# Patient Record
Sex: Female | Born: 1950
Health system: Southern US, Community
[De-identification: ages and names within clinical notes are randomized; demographics above are authoritative.]

## PROBLEM LIST (undated history)

## (undated) DIAGNOSIS — D573 Sickle-cell trait: Secondary | ICD-10-CM

## (undated) DIAGNOSIS — T7840XA Allergy, unspecified, initial encounter: Secondary | ICD-10-CM

## (undated) DIAGNOSIS — Z8051 Family history of malignant neoplasm of kidney: Secondary | ICD-10-CM

## (undated) DIAGNOSIS — D649 Anemia, unspecified: Secondary | ICD-10-CM

## (undated) DIAGNOSIS — C50919 Malignant neoplasm of unspecified site of unspecified female breast: Secondary | ICD-10-CM

## (undated) DIAGNOSIS — Z8052 Family history of malignant neoplasm of bladder: Secondary | ICD-10-CM

## (undated) DIAGNOSIS — C801 Malignant (primary) neoplasm, unspecified: Secondary | ICD-10-CM

## (undated) DIAGNOSIS — Z923 Personal history of irradiation: Secondary | ICD-10-CM

## (undated) DIAGNOSIS — Z803 Family history of malignant neoplasm of breast: Secondary | ICD-10-CM

## (undated) DIAGNOSIS — I10 Essential (primary) hypertension: Secondary | ICD-10-CM

## (undated) HISTORY — DX: Family history of malignant neoplasm of kidney: Z80.51

## (undated) HISTORY — DX: Malignant (primary) neoplasm, unspecified: C80.1

## (undated) HISTORY — DX: Allergy, unspecified, initial encounter: T78.40XA

## (undated) HISTORY — PX: ABDOMINAL HYSTERECTOMY: SHX81

## (undated) HISTORY — DX: Family history of malignant neoplasm of breast: Z80.3

## (undated) HISTORY — DX: Malignant neoplasm of unspecified site of unspecified female breast: C50.919

## (undated) HISTORY — PX: BREAST SURGERY: SHX581

## (undated) HISTORY — DX: Family history of malignant neoplasm of bladder: Z80.52

## (undated) HISTORY — PX: CHOLECYSTECTOMY: SHX55

---

## 2011-10-24 DIAGNOSIS — Z923 Personal history of irradiation: Secondary | ICD-10-CM

## 2011-10-24 HISTORY — DX: Personal history of irradiation: Z92.3

## 2012-04-19 HISTORY — PX: BREAST BIOPSY: SHX20

## 2012-05-20 HISTORY — PX: BREAST LUMPECTOMY: SHX2

## 2013-06-27 ENCOUNTER — Telehealth: Payer: Self-pay | Admitting: *Deleted

## 2013-06-27 NOTE — Telephone Encounter (Signed)
Pt brought her records in and requested to see Dr. Darnelle Catalan.  I confirmed 07/31/13 appt w/ pt.  Mailed before appt letter & packet to pt.  Took paperwork to Med Rec for chart.

## 2013-07-08 ENCOUNTER — Other Ambulatory Visit: Payer: Self-pay | Admitting: *Deleted

## 2013-07-08 DIAGNOSIS — C50412 Malignant neoplasm of upper-outer quadrant of left female breast: Secondary | ICD-10-CM

## 2013-07-08 DIAGNOSIS — C50419 Malignant neoplasm of upper-outer quadrant of unspecified female breast: Secondary | ICD-10-CM | POA: Insufficient documentation

## 2013-07-31 ENCOUNTER — Other Ambulatory Visit (HOSPITAL_BASED_OUTPATIENT_CLINIC_OR_DEPARTMENT_OTHER): Payer: Medicare HMO | Admitting: Lab

## 2013-07-31 ENCOUNTER — Ambulatory Visit (HOSPITAL_BASED_OUTPATIENT_CLINIC_OR_DEPARTMENT_OTHER): Payer: Medicare HMO | Admitting: Oncology

## 2013-07-31 ENCOUNTER — Ambulatory Visit: Payer: Medicare HMO

## 2013-07-31 VITALS — BP 145/78 | HR 75 | Temp 98.5°F | Resp 20 | Ht 61.0 in | Wt 228.2 lb

## 2013-07-31 DIAGNOSIS — C50419 Malignant neoplasm of upper-outer quadrant of unspecified female breast: Secondary | ICD-10-CM

## 2013-07-31 DIAGNOSIS — C50412 Malignant neoplasm of upper-outer quadrant of left female breast: Secondary | ICD-10-CM

## 2013-07-31 DIAGNOSIS — Z170 Estrogen receptor positive status [ER+]: Secondary | ICD-10-CM

## 2013-07-31 LAB — CBC WITH DIFFERENTIAL/PLATELET
BASO%: 0.7 % (ref 0.0–2.0)
Basophils Absolute: 0 10*3/uL (ref 0.0–0.1)
EOS%: 0 % (ref 0.0–7.0)
HGB: 10.8 g/dL — ABNORMAL LOW (ref 11.6–15.9)
MCH: 24.1 pg — ABNORMAL LOW (ref 25.1–34.0)
MCHC: 32 g/dL (ref 31.5–36.0)
NEUT#: 4.1 10*3/uL (ref 1.5–6.5)
RBC: 4.5 10*6/uL (ref 3.70–5.45)
RDW: 14.5 % (ref 11.2–14.5)
lymph#: 1.3 10*3/uL (ref 0.9–3.3)

## 2013-07-31 LAB — COMPREHENSIVE METABOLIC PANEL (CC13)
ALT: 13 U/L (ref 0–55)
AST: 17 U/L (ref 5–34)
Albumin: 3.4 g/dL — ABNORMAL LOW (ref 3.5–5.0)
Alkaline Phosphatase: 89 U/L (ref 40–150)
Anion Gap: 9 mEq/L (ref 3–11)
Calcium: 9.6 mg/dL (ref 8.4–10.4)
Chloride: 107 mEq/L (ref 98–109)
Creatinine: 0.8 mg/dL (ref 0.6–1.1)
Potassium: 3.4 mEq/L — ABNORMAL LOW (ref 3.5–5.1)
Sodium: 144 mEq/L (ref 136–145)

## 2013-07-31 NOTE — Progress Notes (Signed)
ID: Sally Morrison OB: 02/12/1951  MR#: [id number]  CSN#:[id number]  PCP: No PCP Per Patient GYN:   SU:  OTHER MD: the patient's team in [place] consisted of Todd Campbell/ SU [phone]), [name]/ ROnc [phone]) and Consuello Closs Roy/ MOnc [phone])  CHIEF COMPLAINT:  I have a history of breast cancer  HISTORY OF PRESENT ILLNESS: The patient noted a palpable lump in the 2:00 position of the left breast sometime January of 2013. She eventually brought it to medical attention and bilateral diagnostic mammography and left breast ultrason[facility] [place] 01/30/2012 showed interval increase in nodular densities in the upper outer quadrant of the left breast which had been noted and evaluated previously in January of 2011 and November of 2010. Ultrasonography of the left breast showed a 1.4 cm hypoechoic solid lesion, with 2 additional lesions, all very close to each other, in the area of palpable concern... MRI of the left breast with and without contrast 02/23/2012 showed thick walled cystic lesions adjacent to each other in the upper outer quadrant of the left breast measuring altogether 2.2 cm. There was some right axillary adenopathy, the largest lymph node measuring 1.2 cm with "somewhat lobular contours".  On 04/19/2012 the patient underwent biopsy of the area in question in the left breast upper outer quadrant, showing an invasive ductal carcinoma, grade 1, estrogen receptor 90% positive, progesterone receptor 50% positive, with no HER-2 amplification and an Mib-1 of 9%. P53 was negative.. on 05/20/2012 the patient underwent left lumpectomy and sentinel lymph node sampling, with the final pathology confirming a 1.5 cm invasive ductal carcinoma, grade 1, estrogen receptor 100% positive, progesterone receptor 52% positive, with no HER-2 amplification and an MIB-1 of 8%. 0 of 3 sentinel lymph nodes were involved.  The patient's  subsequent history is as detailed below.  INTERVAL HISTORY: Giara was evaluated in the breast clinic 07/30/2013. She'll establish herself in my practice that day  REVIEW OF SYSTEMS: She has some dental problems and feels she is due for a visual checkup. She has occasional palpitations which are closely associated with have much caffeine she takes. There are no associated symptoms. This is now much reduced since she is only having one cup of coffee daily. She has a history of urinary tract infections, but not currently. She has chronic mild back pain and sometimes has difficulty walking as a result. This is not more pronounced or persistent than prior. She has mild hot flashes. These were present before she started anastrozole and did not worsen. She denies problems with vaginal dryness or diffuse arthralgias/myalgias. Aside from the above a detailed review of systems today was noncontributory  PAST MEDICAL HISTORY: No past medical history on file.  PAST SURGICAL HISTORY: No past surgical history on file. Status post hysterectomy and bilateral salpingo-oophorectomy, status post cholecystectomy, status post left lumpectomy and sentinel lymph node sampling  FAMILY HISTORY No family history on file. The patient's father died with dementia at the age of 60. The patient's mother died at the age of 60 from a myocardial infarction. [name] history of rheumatoid arthritis. The patient had 1 full brother and one full sister, as well as several half siblings. There is no history of breast or ovarian cancer in the immediate family.  GYNECOLOGIC HISTORY:  Menarche age 62, first live birth age 62, she is [place] P2. She had her total abdominal hysterectomy and bilateral salpingo-oophorectomy in 2003. She did not use hormone replacement . 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 She is a widow and recently moved to  this area to live with her daughter. The patient's son Gracynn Rajewski lives in [place], [place] and works as an Airline pilot. The patient's daughter Twala Collings teaches special added here in [place]. The patient has 4 grandchildren. She attends "[employer] church for all".    ADVANCED DIRECTIVES: Not in place; at her initial visit on 07/30/2013 the patient was given healthcare power of attorney forms to complete and bring back so they can be scanned into her records.   HEALTH MAINTENANCE: History  Substance Use Topics  . Smoking status: Not on file  . Smokeless tobacco: Not on file  . Alcohol Use: Not on file     Colonoscopy: 2011  PAP: June 2014  Bone density: July 2013  Lipid panel:  Allergies not on file  No current outpatient prescriptions on file.   No current facility-administered medications for this visit.    OBJECTIVE: 62-year-old [place] American woman who appears well Filed Vitals:   07/31/13 1638  BP: 145/78  Pulse: 75  Temp: 98.5 F (36.9 C)  Resp: 20     Body mass index is 43.14 kg/(m^2).    ECOG FS:0 - Asymptomatic  Ocular: Sclerae unicteric, pupils equal, round and reactive to light Ear-nose-throat: Oropharynx clear, dentition fair Lymphatic: No cervical or supraclavicular adenopathy Lungs no rales or rhonchi, good excursion bilaterally Heart regular rate and rhythm, no murmur appreciated Abd soft, nontender, positive bowel sounds MSK no focal spinal tenderness, no joint edema Neuro: non-focal, well-oriented, appropriate affect Breasts: The right breast is unremarkable; the left breast is status post lumpectomy and radiation. There is still moderate hyperpigmentation. There is no evidence of local recurrence. The left axilla is unremarkable   LAB RESULTS:  CMP     Component Value Date/Time   NA 144 07/31/2013 1620    I No results found for this basename: SPEP,  UPEP,   kappa and lambda light chains    Lab Results  Component Value Date    WBC 6.0 07/31/2013      Chemistry      Component Value Date/Time   NA 144 07/31/2013 1620      Component Value Date/Time   CALCIUM 9.6 07/31/2013 1620       No results found for this basename: LABCA2    No components found with this basename: LABCA125    No results found for this basename: INR,  in the last 168 hours  Urinalysis No results found for this basename: colorurine,  appearanceur,  labspec,  phurine,  glucoseu,  hgbur,  bilirubinur,  ketonesur,  proteinur,  urobilinogen,  nitrite,  leukocytesur    STUDIES: Outside studies reviewed with patient. Next mammography will be due June of 2014  ASSESSMENT: 62 y.o. [place] woman  (1) status post left lumpectomy and sentinel lymph node biopsy 05/20/2012 for a pT1c pN0, stage IA invasive ductal carcinoma, grade 1, estrogen receptor 100% positive, progesterone receptor 52% positive, with no HER-2 amplification and an MIB-1 of 8%  (2) Oncotype DX score of 0 predicted a distant disease risk of recurrence over 10 years of approximately 3% if the patient's only systemic treatment was tamoxifen for 5 years. It also predicted no benefit from chemotherapy.  (3) the patient completed adjuvant radiation 09/17/2012, receiving 36 treatments to the left breast using medial and lateral tangential fields to  a dose of 5040 cGy, followed by a boost to the postoperative bed of an additional 1440 cGy (given dose 6648).  (4) anastrozole started December 2013.  PLAN: We reviewed her diagnosis, treatment history, and prognosis. We went into the difference between tamoxifen and the aromatase inhibitors. When she completes 2 years of anastrozole she will have the option of continuing for another 3 years or switching to tamoxifen. We will obtain a repeat bone density at that time to help [place] with that decision.  Otherwise she will return to see [place] again in February and then late June, after her mammogram. At that point we will start seeing her on  an every 6 month basis.  The patient understands the purpose of her treatment is cure. She agrees with the treatment plan as outlined. She knows to call for any problems that may develop before next visit.  Lowella Dell, MD   07/31/2013 6:38 PM

## 2013-07-31 NOTE — Addendum Note (Signed)
Addended by: Lowella Dell on: 07/31/2013 06:51 PM   Modules accepted: Orders

## 2013-08-04 ENCOUNTER — Telehealth: Payer: Self-pay | Admitting: Oncology

## 2013-08-04 NOTE — Telephone Encounter (Signed)
m °

## 2013-09-08 ENCOUNTER — Encounter: Payer: Self-pay | Admitting: *Deleted

## 2013-09-08 NOTE — Progress Notes (Signed)
Mailed after appt letter to pt. 

## 2013-09-12 ENCOUNTER — Other Ambulatory Visit: Payer: Self-pay | Admitting: *Deleted

## 2013-09-12 MED ORDER — ANASTROZOLE 1 MG PO TABS: 1.0000 mg | ORAL_TABLET | Freq: Every day | ORAL | Status: DC

## 2013-09-17 ENCOUNTER — Other Ambulatory Visit: Payer: Self-pay | Admitting: *Deleted

## 2013-09-17 MED ORDER — ANASTROZOLE 1 MG PO TABS: 1.0000 mg | ORAL_TABLET | Freq: Every day | ORAL | Status: DC

## 2013-12-03 ENCOUNTER — Telehealth: Payer: Self-pay | Admitting: *Deleted

## 2013-12-03 NOTE — Telephone Encounter (Signed)
Pt called to see if she was scheduled for a f/u appt w/ Dr. Jana Hakim.  Looked and saw that she was scheduled for labs and appt w/ Amy B.  Gave pt that information and she was fine with that.  Mailed calendar to pt per her request.

## 2013-12-08 ENCOUNTER — Other Ambulatory Visit: Payer: Self-pay | Admitting: Physician Assistant

## 2013-12-08 NOTE — Progress Notes (Signed)
I spoke with patient by phone today. Due to predicted inclement weather on 12/09/2013, she would like to move her appointments for lab and visit from 12/09/2013 to 12/23/2013. A POF was generated to schedule a lab appointment at 1:15, and an appointment with Micah Flesher at 145 on 12/23/2013. Her appointments will be canceled for 12/09/2013. Patient voices understanding of the above.  Micah Flesher, PA-C 12/08/2013

## 2013-12-09 ENCOUNTER — Other Ambulatory Visit: Payer: Medicare HMO

## 2013-12-09 ENCOUNTER — Ambulatory Visit: Payer: Medicare HMO | Admitting: Physician Assistant

## 2013-12-23 ENCOUNTER — Telehealth: Payer: Self-pay | Admitting: Physician Assistant

## 2013-12-23 ENCOUNTER — Encounter: Payer: Self-pay | Admitting: Physician Assistant

## 2013-12-23 ENCOUNTER — Other Ambulatory Visit (HOSPITAL_BASED_OUTPATIENT_CLINIC_OR_DEPARTMENT_OTHER): Payer: Medicare HMO

## 2013-12-23 ENCOUNTER — Ambulatory Visit (HOSPITAL_BASED_OUTPATIENT_CLINIC_OR_DEPARTMENT_OTHER): Payer: Medicare HMO | Admitting: Physician Assistant

## 2013-12-23 VITALS — BP 140/75 | HR 98 | Temp 97.5°F | Resp 18 | Ht 61.0 in | Wt 215.5 lb

## 2013-12-23 DIAGNOSIS — Z780 Asymptomatic menopausal state: Secondary | ICD-10-CM

## 2013-12-23 DIAGNOSIS — C50412 Malignant neoplasm of upper-outer quadrant of left female breast: Secondary | ICD-10-CM

## 2013-12-23 DIAGNOSIS — C50419 Malignant neoplasm of upper-outer quadrant of unspecified female breast: Secondary | ICD-10-CM

## 2013-12-23 DIAGNOSIS — Z853 Personal history of malignant neoplasm of breast: Secondary | ICD-10-CM

## 2013-12-23 DIAGNOSIS — Z170 Estrogen receptor positive status [ER+]: Secondary | ICD-10-CM

## 2013-12-23 LAB — COMPREHENSIVE METABOLIC PANEL (CC13)
ALK PHOS: 90 U/L (ref 40–150)
ALT: 14 U/L (ref 0–55)
ANION GAP: 13 meq/L — AB (ref 3–11)
AST: 16 U/L (ref 5–34)
Albumin: 3.6 g/dL (ref 3.5–5.0)
BUN: 21.2 mg/dL (ref 7.0–26.0)
CALCIUM: 9.6 mg/dL (ref 8.4–10.4)
CO2: 23 mEq/L (ref 22–29)
Chloride: 106 mEq/L (ref 98–109)
Creatinine: 1.5 mg/dL — ABNORMAL HIGH (ref 0.6–1.1)
Glucose: 115 mg/dl (ref 70–140)
Potassium: 3.4 mEq/L — ABNORMAL LOW (ref 3.5–5.1)
Sodium: 142 mEq/L (ref 136–145)
Total Bilirubin: 0.5 mg/dL (ref 0.20–1.20)
Total Protein: 7.5 g/dL (ref 6.4–8.3)

## 2013-12-23 LAB — CBC WITH DIFFERENTIAL/PLATELET
BASO%: 0.7 % (ref 0.0–2.0)
BASOS ABS: 0.1 10*3/uL (ref 0.0–0.1)
EOS ABS: 0 10*3/uL (ref 0.0–0.5)
EOS%: 0 % (ref 0.0–7.0)
HCT: 31.8 % — ABNORMAL LOW (ref 34.8–46.6)
HEMOGLOBIN: 10.2 g/dL — AB (ref 11.6–15.9)
LYMPH%: 15.5 % (ref 14.0–49.7)
MCH: 24.9 pg — ABNORMAL LOW (ref 25.1–34.0)
MCHC: 32.1 g/dL (ref 31.5–36.0)
MCV: 77.7 fL — AB (ref 79.5–101.0)
MONO#: 0.5 10*3/uL (ref 0.1–0.9)
MONO%: 6.7 % (ref 0.0–14.0)
NEUT#: 5.7 10*3/uL (ref 1.5–6.5)
NEUT%: 77.1 % — ABNORMAL HIGH (ref 38.4–76.8)
PLATELETS: 270 10*3/uL (ref 145–400)
RBC: 4.09 10*6/uL (ref 3.70–5.45)
RDW: 14.5 % (ref 11.2–14.5)
WBC: 7.4 10*3/uL (ref 3.9–10.3)
lymph#: 1.1 10*3/uL (ref 0.9–3.3)

## 2013-12-23 MED ORDER — ANASTROZOLE 1 MG PO TABS: 1.0000 mg | ORAL_TABLET | Freq: Every day | ORAL | Status: DC

## 2013-12-23 NOTE — Telephone Encounter (Signed)
, °

## 2013-12-23 NOTE — Progress Notes (Signed)
ID: Sally Morrison OB: 10-14-1951  MR#: [id number]  CSN#:[id number]  PCP: Sally Morrison GYN:   SU:  OTHER Morrison: the patient's team in [place] Campbell/ SU [phone]), Sally Morrison/ ROnc [phone]) and [name]/ [place] [phone])  CHIEF COMPLAINT: Hx Left Breast Cancer   BREAST CANCER HISTORY: The patient noted a palpable lump in the 2:00 position of the left breast sometime January of 2013. She eventually brought it to medical attention and bilateral diagnostic mammography and left breast ultrasonography [facility] [place] 01/30/2012 showed interval increase in nodular densities in the upper outer quadrant of the left breast which had been noted and evaluated previously in January of 2011 and November of 2010. Ultrasonography of the left breast showed a 1.4 cm hypoechoic solid lesion, with 2 additional lesions, all very close to each other, in the area of palpable concern... MRI of the left breast with and without contrast 02/23/2012 showed thick walled cystic lesions adjacent to each other in the upper outer quadrant of the left breast measuring altogether 2.2 cm. There was some right axillary adenopathy, the largest lymph node measuring 1.2 cm with "somewhat lobular contours".  On 04/19/2012 the patient underwent biopsy of the area in question in the left breast upper outer quadrant, showing an invasive ductal carcinoma, grade 1, estrogen receptor 90% positive, progesterone receptor 50% positive, with no HER-2 amplification and an Mib-1 of 9%. P53 was negative.. on 05/20/2012 the patient underwent left lumpectomy and sentinel lymph node sampling, with the final pathology confirming a 1.5 cm invasive ductal carcinoma, grade 1, estrogen receptor 100% positive, progesterone receptor 52% positive, with no HER-2 amplification and an MIB-1 of 8%. 0 of 3 sentinel lymph nodes were involved.  The patient's subsequent history is as  detailed below.  INTERVAL HISTORY: Sally Morrison returns alone today for routine followup of her left breast carcinoma. She was last seen here in October 2014. She continues on anastrozole daily with good tolerance. She has occasional hot flashes, but nothing she would consider problematic. She has some arthritic pain in her knees, but has noted no increased joint pain since starting on the aromatase inhibitor. She denies any problems with vaginal dryness. She's had no vaginal bleeding.  Sally Morrison does have a history of iron deficiency anemia which she tells me is being followed by her primary care physician, Dr. Criss Morrison.  In fact she is scheduled to see Dr. Criss Morrison again next month. She does continue on oral iron supplements in the meanwhile.   REVIEW OF SYSTEMS: Sally Morrison's only complaint today is a "toothache" and she has an appointment with her dentist next week. She's had no illnesses and denies any fevers or chills. She denies any skin changes or rashes. Her energy level is good, as is her appetite. She's had no problems with nausea, emesis, reflux, or change in bowel or bladder habits. She denies any cough, shortness of breath, or chest pain. She does have some occasional postsurgical pain in the left breast which she was concerned about. She's noted no additional changes in the left breast, specifically no skin changes, nipple discharge, or masses. She also denies any abnormal headaches or dizziness. She's had no new or unusual myalgias, arthralgias, or bony pain.  A detailed review of systems is otherwise stable and noncontributory.   PAST MEDICAL HISTORY: Past Medical History  Diagnosis Date  . Breast cancer   . Cancer   . Allergy     PAST SURGICAL HISTORY:  History reviewed. No pertinent past surgical history. Status post hysterectomy and bilateral salpingo-oophorectomy, status post cholecystectomy, status post left lumpectomy and sentinel lymph node sampling  FAMILY HISTORY History reviewed. No  pertinent family history. The patient's father died with dementia at the age of 66. The patient's mother died at the age of 13 from a myocardial infarction. Sally Morrison history of rheumatoid arthritis. The patient had 1 full brother and one full sister, as well as several half siblings. There is no history of breast or ovarian cancer in the immediate family.  GYNECOLOGIC HISTORY:   (Updated 12/23/2013) Menarche age 76, first live birth age 71, she is [place] P2. She had her total abdominal hysterectomy and bilateral salpingo-oophorectomy in 2003. She did not use hormone replacement . 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 She is a widow and recently moved to this area to live with her daughter. The patient's son Sally Morrison lives in [place], [place] and works as an Optometrist. The patient's daughter Sally Morrison teaches special ed here in [place]. The patient has 4 grandchildren. She attends "1 church for all".    ADVANCED DIRECTIVES: Not in place; at her initial visit on 07/30/2013 the patient was given healthcare power of attorney forms to complete and bring back so they can be scanned into her records.   HEALTH MAINTENANCE:  (Updated 12/23/2013) History  Substance Use Topics  . Smoking status: Never Smoker   . Smokeless tobacco: Not on file  . Alcohol Use: No     Colonoscopy: 2011  PAP: June 2014  Bone density: July 2013  Lipid panel:  Not on file/Dr. Criss Morrison   Allergies  Allergen Reactions  . Penicillins Rash  . Aleve [Naproxen Sodium] Itching    Current Outpatient Prescriptions  Medication Sig Dispense Refill  . anastrozole (ARIMIDEX) 1 MG tablet Take 1 tablet (1 mg total) by mouth daily.  90 tablet  3  . aspirin 81 MG tablet Take 81 mg by mouth daily.      . calcium-vitamin D (OSCAL WITH D) 500-200 MG-UNIT per tablet Take 1 tablet by mouth 2  (two) times daily.      . ferrous sulfate 325 (65 FE) MG tablet Take 325 mg by mouth daily with breakfast.      . GLUCOSAMINE CHONDROITIN COMPLX PO Take 1 capsule by mouth 4 (four) times daily.      Marland Kitchen ibuprofen (ADVIL,MOTRIN) 200 MG tablet Take 200 mg by mouth every 6 (six) hours as needed for pain.      Marland Kitchen losartan-hydrochlorothiazide (HYZAAR) 50-12.5 MG per tablet        No current facility-administered medications for this visit.    OBJECTIVE: Middle-aged [place] American woman who appears well and is in no acute distress Filed Vitals:   12/23/13 1357  BP: 140/75  Pulse: 98  Temp: 97.5 F (36.4 C)  Resp: 18     Body mass index is 40.74 kg/(m^2).    ECOG FS:0 - Asymptomatic Filed Weights   12/23/13 1357  Weight: 215 lb 8 oz (97.75 kg)   Physical Exam: HEENT:  Sclerae anicteric.  Oropharynx clear, pink, and moist. Neck supple, trachea midline. No thyromegaly.  NODES:  No cervical or supraclavicular lymphadenopathy palpated.  BREAST EXAM: Right breast is unremarkable. Left breast is status post lumpectomy with no suspicious nodularities or skin changes noted. No evidence of local recurrence.  Axillae are benign bilaterally, no palpable lymphadenopathy. LUNGS:  Clear to auscultation bilaterally.  No wheezes or rhonchi HEART:  Regular rate and rhythm. No murmur appreciated. ABDOMEN:  Soft, nontender. No organomegaly or masses palpated.  Positive bowel sounds.  MSK:  No focal spinal tenderness to palpation. Full range of motion bilaterally in the upper extremities. EXTREMITIES:  No peripheral edema.  No lymphedema in the upper extremities. SKIN:  Benign with no visible rashes or skin lesions. No excessive ecchymoses. No petechiae. The skin turgor. NEURO:  Nonfocal. Well oriented.  Appropriate affect.     LAB RESULTS:  CBC    Component Value Date/Time   WBC 7.4 12/23/2013 1337   RBC 4.09 12/23/2013 1337   HGB 10.2* 12/23/2013 1337   HCT 31.8* 12/23/2013 1337   PLT 270 12/23/2013 1337    MCV 77.7* 12/23/2013 1337   MCH 24.9* 12/23/2013 1337   MCHC 32.1 12/23/2013 1337   RDW 14.5 12/23/2013 1337   LYMPHSABS 1.1 12/23/2013 1337   MONOABS 0.5 12/23/2013 1337   EOSABS 0.0 12/23/2013 1337   BASOSABS 0.1 12/23/2013 1337    CMP     Component Value Date/Time   NA 142 12/23/2013 1337   K 3.4* 12/23/2013 1337   CO2 23 12/23/2013 1337   GLUCOSE 115 12/23/2013 1337   BUN 21.2 12/23/2013 1337   CREATININE 1.5* 12/23/2013 1337   CALCIUM 9.6 12/23/2013 1337   PROT 7.5 12/23/2013 1337   ALBUMIN 3.6 12/23/2013 1337   AST 16 12/23/2013 1337   ALT 14 12/23/2013 1337   ALKPHOS 90 12/23/2013 1337   BILITOT 0.50 12/23/2013 1337     STUDIES:  Patient's last mammogram and bone density were obtained in [place]. She will be due for both studies again in June, and [facility] are being [facility]  ASSESSMENT: 63 y.o. [place] woman  (1) status post left lumpectomy and sentinel lymph node biopsy 05/20/2012 for a pT1c pN0, stage IA invasive ductal carcinoma, grade 1, estrogen receptor 100% positive, progesterone receptor 52% positive, with no HER-2 amplification and an MIB-1 of 8%  (2) Oncotype DX score of 0 predicted a distant disease risk of recurrence over 10 years of approximately 3% if the patient's only systemic treatment was tamoxifen for 5 years. It also predicted no benefit from chemotherapy.  (3) the patient completed adjuvant radiation 09/17/2012, receiving 36 treatments to the left breast using medial and lateral tangential fields to a dose of 5040 cGy, followed by a boost to the postoperative bed of an additional 1440 cGy (given dose 6648).  (4) anastrozole started December 2013.  PLAN: With regards to her breast cancer, Sally Morrison seems to be doing quite well, and I'm making no changes to her current regimen. She will continue on the anastrozole which I have refilled for her today. The plan is to complete at least 2 years of anastrozole (December 2015) at which time we'll discussed either  continuing for another 3 years or switching to tamoxifen.   Sally Morrison will have her next annual mammogram and bone density in June, after which I will see her for repeat labs and physical exam. She will then return 6 months later in December to see Dr. Jana Morrison, and at that point they'll discuss any changes in her treatment regimen.  In the meanwhile, she will continue be followed by her primary care physician, Dr.  Criss Morrison, and I am forwarding today's labs to Dr.  Criss Morrison as well.  All of the above was reviewed  with the patient today. She voices understanding and agreement with our plan, and will call with any changes or problems.  Sally Chaddock, PA-C   12/23/2013 2:32 PM

## 2014-01-21 ENCOUNTER — Other Ambulatory Visit: Payer: Self-pay | Admitting: Oncology

## 2014-04-09 ENCOUNTER — Other Ambulatory Visit: Payer: Self-pay | Admitting: Physician Assistant

## 2014-04-14 ENCOUNTER — Telehealth: Payer: Self-pay | Admitting: *Deleted

## 2014-04-14 ENCOUNTER — Other Ambulatory Visit: Payer: Self-pay | Admitting: Gastroenterology

## 2014-04-14 ENCOUNTER — Other Ambulatory Visit (HOSPITAL_BASED_OUTPATIENT_CLINIC_OR_DEPARTMENT_OTHER): Payer: Medicare HMO

## 2014-04-14 ENCOUNTER — Other Ambulatory Visit: Payer: Self-pay

## 2014-04-14 ENCOUNTER — Telehealth: Payer: Self-pay | Admitting: Nurse Practitioner

## 2014-04-14 ENCOUNTER — Ambulatory Visit (HOSPITAL_BASED_OUTPATIENT_CLINIC_OR_DEPARTMENT_OTHER): Payer: Medicare HMO | Admitting: Nurse Practitioner

## 2014-04-14 VITALS — BP 126/72 | HR 77 | Temp 98.4°F | Resp 18 | Ht 61.0 in | Wt 207.2 lb

## 2014-04-14 DIAGNOSIS — C50419 Malignant neoplasm of upper-outer quadrant of unspecified female breast: Secondary | ICD-10-CM

## 2014-04-14 DIAGNOSIS — Z780 Asymptomatic menopausal state: Secondary | ICD-10-CM

## 2014-04-14 DIAGNOSIS — Z170 Estrogen receptor positive status [ER+]: Secondary | ICD-10-CM

## 2014-04-14 DIAGNOSIS — C50412 Malignant neoplasm of upper-outer quadrant of left female breast: Secondary | ICD-10-CM

## 2014-04-14 DIAGNOSIS — Z853 Personal history of malignant neoplasm of breast: Secondary | ICD-10-CM

## 2014-04-14 LAB — CBC WITH DIFFERENTIAL/PLATELET
BASO%: 0.3 % (ref 0.0–2.0)
Basophils Absolute: 0 10*3/uL (ref 0.0–0.1)
EOS%: 0 % (ref 0.0–7.0)
Eosinophils Absolute: 0 10*3/uL (ref 0.0–0.5)
HEMATOCRIT: 34.2 % — AB (ref 34.8–46.6)
HGB: 11 g/dL — ABNORMAL LOW (ref 11.6–15.9)
LYMPH%: 19.7 % (ref 14.0–49.7)
MCH: 24.9 pg — ABNORMAL LOW (ref 25.1–34.0)
MCHC: 32 g/dL (ref 31.5–36.0)
MCV: 77.9 fL — ABNORMAL LOW (ref 79.5–101.0)
MONO#: 0.3 10*3/uL (ref 0.1–0.9)
MONO%: 5.8 % (ref 0.0–14.0)
NEUT%: 74.2 % (ref 38.4–76.8)
NEUTROS ABS: 4.4 10*3/uL (ref 1.5–6.5)
PLATELETS: 278 10*3/uL (ref 145–400)
RBC: 4.4 10*6/uL (ref 3.70–5.45)
RDW: 14.3 % (ref 11.2–14.5)
WBC: 5.9 10*3/uL (ref 3.9–10.3)
lymph#: 1.2 10*3/uL (ref 0.9–3.3)

## 2014-04-14 LAB — COMPREHENSIVE METABOLIC PANEL (CC13)
ALBUMIN: 3.4 g/dL — AB (ref 3.5–5.0)
ALT: 10 U/L (ref 0–55)
AST: 14 U/L (ref 5–34)
Alkaline Phosphatase: 85 U/L (ref 40–150)
Anion Gap: 8 mEq/L (ref 3–11)
BILIRUBIN TOTAL: 0.47 mg/dL (ref 0.20–1.20)
BUN: 16 mg/dL (ref 7.0–26.0)
CALCIUM: 9.5 mg/dL (ref 8.4–10.4)
CO2: 26 mEq/L (ref 22–29)
Chloride: 107 mEq/L (ref 98–109)
Creatinine: 1.1 mg/dL (ref 0.6–1.1)
GLUCOSE: 119 mg/dL (ref 70–140)
POTASSIUM: 3.6 meq/L (ref 3.5–5.1)
Sodium: 141 mEq/L (ref 136–145)
Total Protein: 7.2 g/dL (ref 6.4–8.3)

## 2014-04-14 NOTE — Telephone Encounter (Signed)
per pof to sch pt mamma-BD-sch @ [facility]-adv pt of date & time-gave pt copy of sch

## 2014-04-14 NOTE — Telephone Encounter (Signed)
Spoke with patient and confirmed appointment with Sally Morrison for 04/14/14 at 130pm.  Patient already had labs today at 1pm.

## 2014-04-14 NOTE — Progress Notes (Signed)
ID: Sally Morrison OB: 01-01-1951  MR#: [id number]  CSN#:[id number]  PCP: [name], MD GYN:   SU:  OTHER MD: the patient's team in [place]/ SU [phone]), [name]/ ROnc [phone]) and [name]/ Barrett [phone])  CHIEF COMPLAINT: Hx Left Breast Cancer   BREAST CANCER HISTORY: The patient noted a palpable lump in the 2:00 position of the left breast sometime January of 2013. She eventually brought it to medical attention and bilateral diagnostic mammography and left breast ultrason[facility] [place] 01/30/2012 showed interval increase in nodular densities in the upper outer quadrant of the left breast which had been noted and evaluated previously in January of 2011 and November of 2010. Ultrasonography of the left breast showed a 1.4 cm hypoechoic solid lesion, with 2 additional lesions, all very close to each other, in the area of palpable concern... MRI of the left breast with and without contrast 02/23/2012 showed thick walled cystic lesions adjacent to each other in the upper outer quadrant of the left breast measuring altogether 2.2 cm. There was some right axillary adenopathy, the largest lymph node measuring 1.2 cm with "somewhat lobular contours".  On 04/19/2012 the patient underwent biopsy of the area in question in the left breast upper outer quadrant, showing an invasive ductal carcinoma, grade 1, estrogen receptor 90% positive, progesterone receptor 50% positive, with no HER-2 amplification and an Mib-1 of 9%. P53 was negative.. on 05/20/2012 the patient underwent left lumpectomy and sentinel lymph node sampling, with the final pathology confirming a 1.5 cm invasive ductal carcinoma, grade 1, estrogen receptor 100% positive, progesterone receptor 52% positive, with no HER-2 amplification and an MIB-1 of 8%. 0 of 3 sentinel lymph nodes were involved.  The patient's subsequent history is as  detailed below.  INTERVAL HISTORY: Sally Morrison returns alone today for routine followup of her left breast carcinoma. She was last seen here in March  2015. She continues on anastrozole daily with good tolerance. Since we last saw her she has had carpal tunnel surgery on her right wrist and is healing well.  She has some arthritic pain in her knees, but has noted no increased joint pain since starting on the aromatase inhibitor. She denies any problems with vaginal dryness. She's had no vaginal bleeding. She has had no other issues, hospitalizations or illness since we last saw her in March.   Sally Morrison have a history of iron deficiency anemia which she tells me is being followed by her primary care physician, [name]. She Morrison continue on oral iron supplements.  REVIEW OF SYSTEMS: Sally Morrison only complaint today is a "toothache" and she has an appointment with her dentist next week. She's had no illnesses and denies any fevers or chills. She denies any skin changes or rashes. Her energy level is good, as is her appetite. She's had no problems with nausea, emesis, reflux, or change in bowel or bladder habits. She denies any cough, shortness of breath, or chest pain. She Morrison have some occasional postsurgical pain in the left breast which she was concerned about. She's noted no additional changes in the left breast, specifically no skin changes, nipple discharge, or masses. She also denies any abnormal headaches or dizziness. She's had no new or unusual myalgias, arthralgias, or bony pain.  A detailed review of systems is otherwise stable and noncontributory.   PAST MEDICAL HISTORY: Past Medical History  Diagnosis Date  . Breast cancer   . Cancer   .  Allergy     PAST SURGICAL HISTORY: No past surgical history on file. Status post hysterectomy and bilateral salpingo-oophorectomy, status post cholecystectomy, status post left lumpectomy and sentinel lymph node sampling  FAMILY HISTORY No family  history on file. The patient's father died with dementia at the age of 85. The patient's mother died at the age of 82 from a myocardial infarction. Sally Morrison history of rheumatoid arthritis. The patient had 1 full brother and one full sister, as well as several half siblings. There is no history of breast or ovarian cancer in the immediate family.  GYNECOLOGIC HISTORY:   (Updated 12/23/2013) Menarche age 56, first live birth age 58, she is [place] P2. She had her total abdominal hysterectomy and bilateral salpingo-oophorectomy in 2003. She did not use hormone replacement . 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 She is a widow and recently moved to this area to live with her daughter. The patient's son Sally Morrison lives in [place], [place] and works as an Optometrist. The patient's daughter Sally Morrison teaches special ed here in [place]. The patient has 4 grandchildren. She attends "[employer] all".    ADVANCED DIRECTIVES: Not in place; at her initial visit on 07/30/2013 the patient was given healthcare power of attorney forms to complete and bring back so they can be scanned into her records.   HEALTH MAINTENANCE:  (Updated 12/23/2013) History  Substance Use Topics  . Smoking status: Never Smoker   . Smokeless tobacco: Not on file  . Alcohol Use: No     Colonoscopy: 2011  PAP: June 2014  Bone density: July 2013  Lipid panel:  Not on file/[name]   Allergies  Allergen Reactions  . Penicillins Rash  . Aleve [Naproxen Sodium] Itching    Current Outpatient Prescriptions  Medication Sig Dispense Refill  . anastrozole (ARIMIDEX) 1 MG tablet Take 1 tablet (1 mg total) by mouth daily.  90 tablet  3  . anastrozole (ARIMIDEX) 1 MG tablet TAKE 1 TABLET BY MOUTH EVERY DAY.  30 tablet  0  . aspirin 81 MG tablet Take 81 mg by mouth daily.      .  calcium-vitamin D (OSCAL WITH D) 500-200 MG-UNIT per tablet Take 1 tablet by mouth 2 (two) times daily.      . ferrous sulfate 325 (65 FE) MG tablet Take 325 mg by mouth daily with breakfast.      . GLUCOSAMINE CHONDROITIN COMPLX PO Take 1 capsule by mouth 4 (four) times daily.      Marland Kitchen ibuprofen (ADVIL,MOTRIN) 200 MG tablet Take 200 mg by mouth every 6 (six) hours as needed for pain.      Marland Kitchen losartan-hydrochlorothiazide (HYZAAR) 50-12.5 MG per tablet        No current facility-administered medications for this visit.    OBJECTIVE: Middle-aged [place] American woman who appears well and is in no acute distress Filed Vitals:   04/14/14 1345  BP: 126/72  Pulse: 77  Temp: 98.4 F (36.9 C)  Resp: 18     Body mass index is 39.17 kg/(m^2).    ECOG FS:0 - Asymptomatic Filed Weights   04/14/14 1345  Weight: 207 lb 3.2 oz (93.985 kg)   Physical Exam: HEENT:  Sclerae anicteric.  Oropharynx clear, pink, and moist. Neck supple, trachea midline. No thyromegaly.  NODES:  No cervical or supraclavicular lymphadenopathy palpated.  BREAST EXAM: Right breast is unremarkable. Left breast is status post lumpectomy with no suspicious nodularities or skin changes noted. She Morrison have some thickening in the lower quadrant which she relates is unchanged since radiation. She is due for mammogram now.  No evidence of local recurrence. Axillae are benign bilaterally, no palpable lymphadenopathy. LUNGS:  Clear to auscultation bilaterally.  No wheezes or rhonchi HEART:  Regular rate and rhythm. No murmur appreciated. ABDOMEN:  Soft, nontender. No organomegaly or masses palpated.  Positive bowel sounds.  MSK:  No focal spinal tenderness to palpation. Full range of motion bilaterally in the upper extremities. EXTREMITIES:  No peripheral edema.  No lymphedema in the upper extremities. SKIN:  Benign with no visible rashes or skin lesions. No excessive ecchymoses. No petechiae. The skin turgor. NEURO:  Nonfocal. Well  oriented.  Appropriate affect.     LAB RESULTS:  CBC    Component Value Date/Time   WBC 5.9 04/14/2014 1304   RBC 4.40 04/14/2014 1304   HGB 11.0* 04/14/2014 1304   HCT 34.2* 04/14/2014 1304   PLT 278 04/14/2014 1304   MCV 77.9* 04/14/2014 1304   MCH 24.9* 04/14/2014 1304   MCHC 32.0 04/14/2014 1304   RDW 14.3 04/14/2014 1304   LYMPHSABS 1.2 04/14/2014 1304   MONOABS 0.3 04/14/2014 1304   EOSABS 0.0 04/14/2014 1304   BASOSABS 0.0 04/14/2014 1304    CMP     Component Value Date/Time   NA 141 04/14/2014 1304   K 3.6 04/14/2014 1304   CO2 26 04/14/2014 1304   GLUCOSE 119 04/14/2014 1304   BUN 16.0 04/14/2014 1304   CREATININE 1.1 04/14/2014 1304   CALCIUM 9.5 04/14/2014 1304   PROT 7.2 04/14/2014 1304   ALBUMIN 3.4* 04/14/2014 1304   AST 14 04/14/2014 1304   ALT 10 04/14/2014 1304   ALKPHOS 85 04/14/2014 1304   BILITOT 0.47 04/14/2014 1304     STUDIES:  Patient's last mammogram and bone density were obtained in [place]. She is due for both studies now (in June), and these are being scheduled at the [facility]. She tried to have this done earlier in the month but they had not yet obtained her old films and told her they needed to wait until those films were available for comparison. She hopes to have this done by the end of next week at the latest.   ASSESSMENT: 63 y.o. [place] woman  (1) status post left lumpectomy and sentinel lymph node biopsy 05/20/2012 for a pT1c pN0, stage IA invasive ductal carcinoma, grade 1, estrogen receptor 100% positive, progesterone receptor 52% positive, with no HER-2 amplification and an MIB-1 of 8%  (2) Oncotype DX score of 0 predicted a distant disease risk of recurrence over 10 years of approximately 3% if the patient's only systemic treatment was tamoxifen for 5 years. It also predicted no benefit from chemotherapy.  (3) the patient completed adjuvant radiation 09/17/2012, receiving 36 treatments to the left breast using medial and lateral  tangential fields to a dose of 5040 cGy, followed by a boost to the postoperative bed of an additional 1440 cGy (given dose 6648).  (4) anastrozole started December 2013.  PLAN: With regards to her breast cancer, Alvis Lemmings seems to be doing quite well, and I'm making no changes to her current regimen. She will continue on the anastrozole. The plan is to complete at least 2 years of anastrozole (December 2015) at which time we'll discussed either continuing for another 3 years or switching  to tamoxifen.   [place] is currently due for  next annual mammogram and bone density and states she plans to have this done later this week or next week. Her physical exam is unremarkable. Marland Kitchen She will then return 6 months later in December to see Dr. Jana Hakim, and at that point they'll discuss any changes in her treatment regimen.  In the meanwhile, she will continue be followed by her primary care physician, Dr.  [name].  All of the above was reviewed with the patient today. She voices understanding and agreement with our plan, and will call with any changes or problems.  [facility], NP AOCNP   04/14/2014 1:59 PM

## 2014-04-15 LAB — VITAMIN D 25 HYDROXY (VIT D DEFICIENCY, FRACTURES): VIT D 25 HYDROXY: 59 ng/mL (ref 30–89)

## 2014-04-21 ENCOUNTER — Ambulatory Visit: Payer: Self-pay | Admitting: Physician Assistant

## 2014-05-01 ENCOUNTER — Encounter (HOSPITAL_COMMUNITY): Payer: Self-pay | Admitting: Pharmacy Technician

## 2014-05-12 ENCOUNTER — Other Ambulatory Visit: Payer: Self-pay

## 2014-05-18 ENCOUNTER — Encounter (HOSPITAL_COMMUNITY): Payer: Self-pay | Admitting: *Deleted

## 2014-05-21 ENCOUNTER — Encounter (HOSPITAL_COMMUNITY)
Admission: RE | Disposition: A | Discharge status: Home / Self Care | Payer: Self-pay | Source: Ambulatory Visit | Attending: Gastroenterology

## 2014-05-21 ENCOUNTER — Ambulatory Visit (HOSPITAL_COMMUNITY): Payer: Medicare HMO | Admitting: Anesthesiology

## 2014-05-21 ENCOUNTER — Encounter (HOSPITAL_COMMUNITY): Payer: Medicare HMO | Admitting: Anesthesiology

## 2014-05-21 ENCOUNTER — Encounter (HOSPITAL_COMMUNITY): Payer: Self-pay | Admitting: Anesthesiology

## 2014-05-21 ENCOUNTER — Ambulatory Visit (HOSPITAL_COMMUNITY)
Admission: RE | Admit: 2014-05-21 | Discharge: 2014-05-21 | Disposition: A | Discharge status: Home / Self Care | Payer: Medicare HMO | Source: Ambulatory Visit | Attending: Gastroenterology | Admitting: Gastroenterology

## 2014-05-21 DIAGNOSIS — Z888 Allergy status to other drugs, medicaments and biological substances status: Secondary | ICD-10-CM | POA: Insufficient documentation

## 2014-05-21 DIAGNOSIS — K620 Anal polyp: Secondary | ICD-10-CM | POA: Diagnosis not present

## 2014-05-21 DIAGNOSIS — I10 Essential (primary) hypertension: Secondary | ICD-10-CM | POA: Insufficient documentation

## 2014-05-21 DIAGNOSIS — Z6839 Body mass index (BMI) 39.0-39.9, adult: Secondary | ICD-10-CM | POA: Diagnosis not present

## 2014-05-21 DIAGNOSIS — Z853 Personal history of malignant neoplasm of breast: Secondary | ICD-10-CM | POA: Diagnosis not present

## 2014-05-21 DIAGNOSIS — D175 Benign lipomatous neoplasm of intra-abdominal organs: Secondary | ICD-10-CM | POA: Diagnosis not present

## 2014-05-21 DIAGNOSIS — Z7982 Long term (current) use of aspirin: Secondary | ICD-10-CM | POA: Diagnosis not present

## 2014-05-21 DIAGNOSIS — K621 Rectal polyp: Secondary | ICD-10-CM

## 2014-05-21 DIAGNOSIS — D649 Anemia, unspecified: Secondary | ICD-10-CM | POA: Diagnosis not present

## 2014-05-21 DIAGNOSIS — D573 Sickle-cell trait: Secondary | ICD-10-CM | POA: Diagnosis not present

## 2014-05-21 DIAGNOSIS — Z79899 Other long term (current) drug therapy: Secondary | ICD-10-CM | POA: Diagnosis not present

## 2014-05-21 DIAGNOSIS — Z1211 Encounter for screening for malignant neoplasm of colon: Secondary | ICD-10-CM | POA: Diagnosis present

## 2014-05-21 DIAGNOSIS — Z880 Allergy status to penicillin: Secondary | ICD-10-CM | POA: Diagnosis not present

## 2014-05-21 HISTORY — DX: Essential (primary) hypertension: I10

## 2014-05-21 HISTORY — PX: COLONOSCOPY WITH PROPOFOL: SHX5780

## 2014-05-21 HISTORY — DX: Anemia, unspecified: D64.9

## 2014-05-21 HISTORY — DX: Sickle-cell trait: D57.3

## 2014-05-21 SURGERY — COLONOSCOPY WITH PROPOFOL
Anesthesia: Monitor Anesthesia Care

## 2014-05-21 MED ORDER — PROPOFOL 10 MG/ML IV BOLUS
INTRAVENOUS | Status: AC
  Filled 2014-05-21: qty 20

## 2014-05-21 MED ORDER — LACTATED RINGERS IV SOLN: INTRAVENOUS | Status: DC

## 2014-05-21 MED ORDER — PROMETHAZINE HCL 25 MG/ML IJ SOLN: 6.2500 mg | INTRAMUSCULAR | Status: DC | PRN

## 2014-05-21 MED ORDER — PROPOFOL INFUSION 10 MG/ML OPTIME
INTRAVENOUS | Status: DC | PRN
  Administered 2014-05-21: 100 ug/kg/min via INTRAVENOUS

## 2014-05-21 MED ORDER — PROPOFOL 10 MG/ML IV BOLUS
INTRAVENOUS | Status: DC | PRN
  Administered 2014-05-21: 40 mg via INTRAVENOUS
  Administered 2014-05-21: 20 mg via INTRAVENOUS

## 2014-05-21 MED ORDER — SODIUM CHLORIDE 0.9 % IV SOLN: INTRAVENOUS | Status: DC

## 2014-05-21 MED ORDER — LACTATED RINGERS IV SOLN
INTRAVENOUS | Status: DC | PRN
  Administered 2014-05-21: 07:00:00 via INTRAVENOUS

## 2014-05-21 SURGICAL SUPPLY — 21 items

## 2014-05-21 NOTE — Discharge Instructions (Signed)

## 2014-05-21 NOTE — Op Note (Addendum)
[facility] [place], [address]   OPERATIVE PROCEDURE REPORT  PATIENT: Sally Morrison, Sally Morrison  MR#: [id number] BIRTHDATE: 1951/08/06 GENDER: Female ENDOSCOPIST: Edmonia James, MD ASSISTANT:   Cherylynn Ridges, technician Verlon Au, RN, BSN PROCEDURE DATE: 05/21/2014 PRE-PROCEDURE PREPARATION: The patient was prepped with a gallon of Golytely the night prior to the procedure.  The patient was fasted for 8 hours prior to the procedure.  PRE-PROCEDURE PHYSICAL: Patient has stable vital signs.  Neck is supple.  There is no JVD, thyromegaly or LAD.  Chest clear to auscultation.  S1 and S2 regular.  Abdomen soft, non-distended, non-tender with NABS. PROCEDURE:     Colonoscopy with cold biopsies x 2. ASA CLASS:     Class III INDICATIONS:     1.  Colorectal cancer screening. MEDICATIONS:     Propofol (Diprivan) 168 mg IV.  DESCRIPTION OF PROCEDURE: After the risks, benefits, and alternatives of the procedure were thoroughly explained [including a 10% missed rate of cancer and polyps], informed consent was obtained.  Digital rectal exam was performed.  The Pentax video colonoscope [id number]  was introduced through the anus  and advanced to the cecum. No adverse events experienced.  The quality of the prep was excellent, using Colyte . Multiple washes were done. Small lesions could be missed. The instrument was then slowly withdrawn as the colon was fully examined.    COLON FINDINGS: Two diminutive sessile polyps were found in the rectum and were removed by cold biopsies x 2. A lipoma was seen in the proximal right colon that was recognized by its yellowish submucosal appearance. The rest of the entire colonic mucosa appeared healthy with a normal vascular pattern.  No masses, diverticula or AVMs were noted.  The appendiceal orifice and the ICV were identified and photographed. Retroflexed views revealed no abnormalities.  The patient tolerated the procedure  without immediate complications.  The scope was then withdrawn from the patient and the procedure terminated.   TIME TO CECUM:   06 minutes 00 seconds WITHDRAW TIME:  06 minutes 00 seconds   IMPRESSION:     Two diminutive sessile polyps were found in the rectum-removed by cold biopsies; lipoma in the proximal right colon; otherwise normal colonoscopy upto the cecum.      RECOMMENDATIONS:     1.  Await pathology results. 2.  Hold all NSAIDS including Aspirin for 2 weeks. 3.  Continue current medications. 4.  High fiber diet with liberal fluid intake. 5.  OP follow-up is advised on a PRN basis.   REPEAT EXAM:      for a repeat colonoscopy in 5-10 years, pending biopsy results.  If the patient has any abnormal GI symptoms in the interim, she have been advised to contact the office as soon as possible for further recommendations.   CPT CODES:     [id number], Colonoscopy with biopsies   DIAGNOSIS CODES:     V76.51 Colorectal cancer screening 211.3 Polyps   REFERRED EX:BMWUX [name], M.D.  eSigned:  Dr. Edmonia James, MD 05/21/2014 8:44 AM Revised: 05/21/2014 8:44 AM  PATIENT NAME:  [name], Spoon MR#: [id number]

## 2014-05-21 NOTE — Anesthesia Postprocedure Evaluation (Signed)
Anesthesia Post Note  Patient: Sally Morrison  Procedure(s) Performed: Procedure(s) (LRB): COLONOSCOPY WITH PROPOFOL (N/A)  Anesthesia type: MAC  Patient location: PACU  Post pain: Pain level controlled  Post assessment: Post-op Vital signs reviewed  Last Vitals:  Filed Vitals:   05/21/14 0830  BP: 135/72  Pulse: 69  Temp:   Resp: 21    Post vital signs: Reviewed  Level of consciousness: sedated  Complications: No apparent anesthesia complications

## 2014-05-21 NOTE — Transfer of Care (Signed)
Immediate Anesthesia Transfer of Care Note  Patient: Sally Morrison  Procedure(s) Performed: Procedure(s) (LRB): COLONOSCOPY WITH PROPOFOL (N/A)  Patient Location: PACU  Anesthesia Type: MAC  Level of Consciousness: sedated, patient cooperative and responds to stimulation  Airway & Oxygen Therapy: Patient Spontanous Breathing and Patient connected to face mask oxgen  Post-op Assessment: Report given to PACU RN and Post -op Vital signs reviewed and stable  Post vital signs: Reviewed and stable  Complications: No apparent anesthesia complications

## 2014-05-21 NOTE — Anesthesia Preprocedure Evaluation (Addendum)
Anesthesia Evaluation  Patient identified by MRN, date of birth, ID band Patient awake    Reviewed: Allergy & Precautions, H&P , NPO status , Patient's Chart, lab work & pertinent test results  History of Anesthesia Complications (+) DIFFICULT AIRWAY and history of anesthetic complications (Stated that had a severe sore throat with prior general anesthesia and was informed that ETT placement was difficult. per anesthesia provider.)  Airway Mallampati: III TM Distance: <3 FB Neck ROM: Full  Mouth opening: Limited Mouth Opening  Dental  (+) Chipped, Dental Advisory Given,    Pulmonary neg pulmonary ROS, former smoker,  breath sounds clear to auscultation  Pulmonary exam normal       Cardiovascular hypertension, Pt. on medications Rhythm:Regular Rate:Normal     Neuro/Psych negative neurological ROS  negative psych ROS   GI/Hepatic negative GI ROS, Neg liver ROS,   Endo/Other  Morbid obesity  Renal/GU negative Renal ROS  negative genitourinary   Musculoskeletal negative musculoskeletal ROS (+)   Abdominal   Peds negative pediatric ROS (+)  Hematology  (+) anemia ,   Anesthesia Other Findings   Reproductive/Obstetrics Hx of Breast Cancer                         Anesthesia Physical Anesthesia Plan  ASA: III  Anesthesia Plan: MAC   Post-op Pain Management:    Induction: Intravenous  Airway Management Planned: Simple Face Mask  Additional Equipment:   Intra-op Plan:   Post-operative Plan: Extubation in OR  Informed Consent: I have reviewed the patients History and Physical, chart, labs and discussed the procedure including the risks, benefits and alternatives for the proposed anesthesia with the patient or authorized representative who has indicated his/her understanding and acceptance.   Dental advisory given  Plan Discussed with: CRNA  Anesthesia Plan Comments:         Anesthesia Quick Evaluation

## 2014-05-21 NOTE — H&P (Signed)
Sally Morrison is an 63 y.o. female.   Chief Complaint: Colorectal cancer screening. HPI: Patient is here for a colonoscopy. She has a history of difficult intubation and is therefore having her colonoscopy in a hospital setting. She office notes for further details.  Past Medical History  Diagnosis Date  . Breast cancer   . Cancer   . Allergy   . Hypertension   . Anemia   . Sickle cell trait    Past Surgical History  Procedure Laterality Date  . Breast surgery      left breast lumpectomy  . Abdominal hysterectomy    . Cholecystectomy     History reviewed. No pertinent family history. Social History:  reports that she has quit smoking. Her smoking use included Cigarettes. She smoked 0.00 packs per day. She does not have any smokeless tobacco history on file. She reports that she does not drink alcohol or use illicit drugs.  Allergies:  Allergies  Allergen Reactions  . Penicillins Rash  . Aleve [Naproxen Sodium] Itching   Medications Prior to Admission  Medication Sig Dispense Refill  . anastrozole (ARIMIDEX) 1 MG tablet Take 1 mg by mouth at bedtime.      Marland Kitchen aspirin 81 MG tablet Take 81 mg by mouth every morning.       . calcium-vitamin D (OSCAL WITH D) 500-200 MG-UNIT per tablet Take 1 tablet by mouth 2 (two) times daily.      . ferrous sulfate 325 (65 FE) MG tablet Take 325 mg by mouth daily with breakfast.      . GLUCOSAMINE CHONDROITIN COMPLX PO Take 1 capsule by mouth 4 (four) times daily.      Marland Kitchen ibuprofen (ADVIL,MOTRIN) 200 MG tablet Take 800 mg by mouth every 6 (six) hours as needed for moderate pain.       Marland Kitchen losartan-hydrochlorothiazide (HYZAAR) 50-12.5 MG per tablet Take 0.5 tablets by mouth every morning.       Marland Kitchen VITAMIN D, CHOLECALCIFEROL, PO Take 1 capsule by mouth daily.       No results found for this or any previous visit (from the past 48 hour(s)). No results found.  Review of Systems  Constitutional: Negative.   HENT: Negative.   Eyes: Negative.    Respiratory: Negative.   Cardiovascular: Negative.   Gastrointestinal: Negative.   Genitourinary: Negative.   Musculoskeletal: Positive for joint pain.  Skin: Negative.   Neurological: Negative.   Endo/Heme/Allergies: Negative.   Psychiatric/Behavioral: Negative.    There were no vitals taken for this visit. Physical Exam  Constitutional: She is oriented to person, place, and time. She appears well-developed and well-nourished.  HENT:  Head: Normocephalic and atraumatic.  Eyes: Conjunctivae and EOM are normal. Pupils are equal, round, and reactive to light.  Neck: Normal range of motion. Neck supple.  Cardiovascular: Normal rate and regular rhythm.   Respiratory: Effort normal and breath sounds normal.  GI: Soft. Bowel sounds are normal.  Musculoskeletal: Normal range of motion.  Neurological: She is alert and oriented to person, place, and time.  Skin: Skin is warm and dry.  Psychiatric: She has a normal mood and affect. Her behavior is normal. Judgment and thought content normal.   Assessment/Plan Colorectal cancer screening: Proceed with a colonoscopy at this time.   Meggan Dhaliwal 05/21/2014, 7:14 AM

## 2014-05-25 ENCOUNTER — Encounter (HOSPITAL_COMMUNITY): Payer: Self-pay | Admitting: Gastroenterology

## 2014-05-29 ENCOUNTER — Other Ambulatory Visit: Payer: Self-pay | Admitting: Physician Assistant

## 2014-05-29 DIAGNOSIS — Z853 Personal history of malignant neoplasm of breast: Secondary | ICD-10-CM

## 2014-05-29 DIAGNOSIS — N6489 Other specified disorders of breast: Secondary | ICD-10-CM

## 2014-05-29 DIAGNOSIS — N6459 Other signs and symptoms in breast: Secondary | ICD-10-CM

## 2014-06-01 ENCOUNTER — Other Ambulatory Visit: Payer: Self-pay | Admitting: Nurse Practitioner

## 2014-06-01 DIAGNOSIS — N6489 Other specified disorders of breast: Secondary | ICD-10-CM

## 2014-06-01 DIAGNOSIS — Z780 Asymptomatic menopausal state: Secondary | ICD-10-CM

## 2014-06-01 DIAGNOSIS — Z853 Personal history of malignant neoplasm of breast: Secondary | ICD-10-CM

## 2014-06-01 DIAGNOSIS — N6459 Other signs and symptoms in breast: Secondary | ICD-10-CM

## 2014-06-03 ENCOUNTER — Other Ambulatory Visit: Payer: Self-pay

## 2014-06-03 ENCOUNTER — Other Ambulatory Visit: Payer: Self-pay | Admitting: Oncology

## 2014-06-03 ENCOUNTER — Other Ambulatory Visit: Payer: Self-pay | Admitting: Nurse Practitioner

## 2014-06-03 DIAGNOSIS — N6489 Other specified disorders of breast: Secondary | ICD-10-CM

## 2014-06-03 DIAGNOSIS — Z853 Personal history of malignant neoplasm of breast: Secondary | ICD-10-CM

## 2014-06-03 DIAGNOSIS — N6459 Other signs and symptoms in breast: Secondary | ICD-10-CM

## 2014-06-08 ENCOUNTER — Ambulatory Visit
Admission: RE | Admit: 2014-06-08 | Discharge: 2014-06-08 | Disposition: A | Discharge status: Home / Self Care | Payer: Private Health Insurance - Indemnity | Source: Ambulatory Visit | Attending: Nurse Practitioner | Admitting: Nurse Practitioner

## 2014-06-08 ENCOUNTER — Encounter (INDEPENDENT_AMBULATORY_CARE_PROVIDER_SITE_OTHER): Payer: Self-pay

## 2014-06-08 DIAGNOSIS — N6459 Other signs and symptoms in breast: Secondary | ICD-10-CM

## 2014-06-08 DIAGNOSIS — Z853 Personal history of malignant neoplasm of breast: Secondary | ICD-10-CM

## 2014-06-08 DIAGNOSIS — N6489 Other specified disorders of breast: Secondary | ICD-10-CM

## 2014-09-29 ENCOUNTER — Ambulatory Visit: Payer: Self-pay | Admitting: Oncology

## 2014-12-09 ENCOUNTER — Telehealth: Payer: Self-pay | Admitting: *Deleted

## 2014-12-09 NOTE — Telephone Encounter (Signed)
Pt called asking when her appt is for Dr. Jana Hakim. Informed her that she had an appt back in Dec that she missed.  Went ahead and scheduled and confirmed her to see Dr. Jana Hakim on 12/15/14.

## 2014-12-15 ENCOUNTER — Telehealth: Payer: Self-pay | Admitting: Oncology

## 2014-12-15 ENCOUNTER — Ambulatory Visit (HOSPITAL_BASED_OUTPATIENT_CLINIC_OR_DEPARTMENT_OTHER): Payer: Private Health Insurance - Indemnity | Admitting: Oncology

## 2014-12-15 VITALS — BP 143/65 | HR 61 | Temp 98.6°F | Resp 18 | Ht 61.0 in | Wt 206.0 lb

## 2014-12-15 DIAGNOSIS — Z170 Estrogen receptor positive status [ER+]: Secondary | ICD-10-CM

## 2014-12-15 DIAGNOSIS — C50412 Malignant neoplasm of upper-outer quadrant of left female breast: Secondary | ICD-10-CM

## 2014-12-15 MED ORDER — ANASTROZOLE 1 MG PO TABS: 1.0000 mg | ORAL_TABLET | Freq: Every day | ORAL | Status: DC

## 2014-12-15 NOTE — Telephone Encounter (Signed)
per pof to sch pt appt-sch DEXA *mamma-gave pt copy of sch

## 2014-12-15 NOTE — Progress Notes (Signed)
ID: Sally Morrison OB: May 07, 1951  MR#: [id number]  CSN#:[id number]  PCP: [name], MD GYN:   SU:  OTHER MD: the patient's team in [place]/ SU [phone]), [name]/ ROnc [phone]) and Shailja Roy/ [place] [phone])  CHIEF COMPLAINT:estrogen receptor positive Left Breast Cancer  CURRENT TREATMENT: Anastrozole   BREAST CANCER HISTORY: From the original intake note:  The patient noted a palpable lump in the 2:00 position of the left breast sometime January of 2013. She eventually brought it to medical attention and bilateral diagnostic mammography and left breast ultrasonography [facility] [place] 01/30/2012 showed interval increase in nodular densities in the upper outer quadrant of the left breast which had been noted and evaluated previously in January of 2011 and November of 2010. Ultrasonography of the left breast showed a 1.4 cm hypoechoic solid lesion, with 2 additional lesions, all very close to each other, in the area of palpable concern... MRI of the left breast with and without contrast 02/23/2012 showed thick walled cystic lesions adjacent to each other in the upper outer quadrant of the left breast measuring altogether 2.2 cm. There was some right axillary adenopathy, the largest lymph node measuring 1.2 cm with "somewhat lobular contours".  On 04/19/2012 the patient underwent biopsy of the area in question in the left breast upper outer quadrant, showing an invasive ductal carcinoma, grade 1, estrogen receptor 90% positive, progesterone receptor 50% positive, with no HER-2 amplification and an Mib-1 of 9%. P53 was negative.. on 05/20/2012 the patient underwent left lumpectomy and sentinel lymph node sampling, with the final pathology confirming a 1.5 cm invasive ductal carcinoma, grade 1, estrogen receptor 100% positive, progesterone receptor 52% positive, with no HER-2 amplification and an MIB-1 of  8%. 0 of 3 sentinel lymph nodes were involved.  The patient's subsequent history is as detailed below.  INTERVAL HISTORY: Esteen returns  today for routine followup of her left breast carcinoma accompanied by her grandson Sally Morrison. She continues on anastrozole, with occasional hot flashes, no vaginal dryness, and no arthralgias or myalgias that she is aware of. She obtains it at a good price.  REVIEW OF SYSTEMS: Occasionally she has fleeting pains in the surgical breast. This is not constant or intense.A detailed review of systems today was otherwise entirely negative  PAST MEDICAL HISTORY: Past Medical History  Diagnosis Date  . Breast cancer   . Cancer   . Allergy   . Hypertension   . Anemia   . Sickle cell trait     PAST SURGICAL HISTORY: Past Surgical History  Procedure Laterality Date  . Breast surgery      left breast lumpectomy  . Abdominal hysterectomy    . Cholecystectomy    . Colonoscopy with propofol N/A 05/21/2014    Procedure: COLONOSCOPY WITH PROPOFOL;  Surgeon: Juanita Craver, MD;  Location: [facility];  Service: Endoscopy;  Laterality: N/A;   Status post hysterectomy and bilateral salpingo-oophorectomy, status post cholecystectomy, status post left lumpectomy and sentinel lymph node sampling  FAMILY HISTORY No family history on file. The patient's father died with dementia at the age of 47. The patient's mother died at the age of 18 from a myocardial infarction. [name] history of rheumatoid arthritis. The patient had 1 full brother and one full sister, as well as several half siblings. There is no history of breast or ovarian cancer in the immediate family.  GYNECOLOGIC HISTORY:   (Updated 12/23/2013) Menarche age 76, first live birth  age 47, she is GX P2. She had her total abdominal hysterectomy and bilateral salpingo-oophorectomy in 2003. She did not use hormone replacement . 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 She is a widow and recently moved to this area to live with her daughter. The patient's son Sally Morrison lives in [place], [place] and works as an Optometrist. The patient's daughter Sally Morrison teaches special ed here in [place]. The patient has 4 grandchildren. She attends "[employer] all".    ADVANCED DIRECTIVES: Not in place; at her initial visit on 07/30/2013 the patient was given healthcare power of attorney forms to complete and bring back so they can be scanned into her records.   HEALTH MAINTENANCE:  (Updated 12/23/2013) History  Substance Use Topics  . Smoking status: Former Smoker    Types: Cigarettes  . Smokeless tobacco: Not on file     Comment: smoked in College for 2-3 years  . Alcohol Use: No     Colonoscopy: 2011  PAP: June 2014  Bone density: July 2013  Lipid panel:  Not on file/[name]   Allergies  Allergen Reactions  . Penicillins Rash  . Aleve [Naproxen Sodium] Itching    Current Outpatient Prescriptions  Medication Sig Dispense Refill  . anastrozole (ARIMIDEX) 1 MG tablet Take 1 tablet (1 mg total) by mouth at bedtime. 90 tablet 4  . losartan-hydrochlorothiazide (HYZAAR) 50-12.5 MG per tablet Take 0.5 tablets by mouth every morning.      No current facility-administered medications for this visit.    OBJECTIVE: Middle-aged [place] American woman who appears stated age 65 Vitals:   12/15/14 1621  BP: 143/65  Pulse: 61  Temp: 98.6 F (37 C)  Resp: 18     Body mass index is 38.94 kg/(m^2).    ECOG FS:0 - Asymptomatic Filed Weights   12/15/14 1621  Weight: 206 lb (93.441 kg)   Sclerae unicteric, pupils round and equal Oropharynx clear and moist No cervical or supraclavicular adenopathy Lungs no rales or rhonchi Heart regular rate and rhythm Abd soft,obese, nontender, positive bowel sounds MSK no focal spinal tenderness, no upper extremity  lymphedema Neuro: nonfocal, well oriented, appropriate affect Breasts: the right breast is unremarkable. The left breast is status post lumpectomy and radiation. There is no evidence of local recurrence. The area that occasionally hurts shows no mass or skin change. The left axilla is benign.   LAB RESULTS:  CBC    Component Value Date/Time   WBC 5.9 04/14/2014 1304   RBC 4.40 04/14/2014 1304   HGB 11.0* 04/14/2014 1304   HCT 34.2* 04/14/2014 1304   PLT 278 04/14/2014 1304   MCV 77.9* 04/14/2014 1304   MCH 24.9* 04/14/2014 1304   MCHC 32.0 04/14/2014 1304   RDW 14.3 04/14/2014 1304   LYMPHSABS 1.2 04/14/2014 1304   MONOABS 0.3 04/14/2014 1304   EOSABS 0.0 04/14/2014 1304   BASOSABS 0.0 04/14/2014 1304    CMP     Component Value Date/Time   NA 141 04/14/2014 1304   K 3.6 04/14/2014 1304   CO2 26 04/14/2014 1304   GLUCOSE 119 04/14/2014 1304   BUN 16.0 04/14/2014 1304   CREATININE 1.1 04/14/2014 1304   CALCIUM 9.5 04/14/2014 1304   PROT 7.2 04/14/2014 1304   ALBUMIN 3.4* 04/14/2014 1304   AST 14 04/14/2014 1304   ALT 10 04/14/2014  1304   ALKPHOS 85 04/14/2014 1304   BILITOT 0.47 04/14/2014 1304     STUDIES:  Patient's last mammogram and bone density were obtained in [place]. She will be due for both studies again in June, and these are being scheduled at the [facility].  ASSESSMENT: 64 y.o.  woman  (1) status post left lumpectomy and sentinel lymph node biopsy 05/20/2012 for a pT1c pN0, stage IA invasive ductal carcinoma, grade 1, estrogen receptor 100% positive, progesterone receptor 52% positive, with no HER-2 amplification and an MIB-1 of 8%  (2) Oncotype DX score of 0 predicted a distant disease risk of recurrence over 10 years of approximately 3% if the patient's only systemic treatment was tamoxifen for 5 years. It also predicted no benefit from chemotherapy.  (3) the patient completed adjuvant radiation 09/17/2012, receiving 36 treatments to  the left breast using medial and lateral tangential fields to a dose of 5040 cGy, followed by a boost to the postoperative bed of an additional 1440 cGy (given dose 6648).  (4) anastrozole started December 2013.  PLAN: Grettais tolerating the anastrozole well and the plan will be to continue that for 5 years. We have tried to obtain a bone density previously. It is not clear to me or to her why this has not yet been done. I have placed an order for it to be done in August before her August visit here. Of course she will have lab work at that time as well.  She has a good understanding of the overall plan. She agrees with it. She knows the goal of treatment in her case is cure. She will call with any problems that may develop before her next visit here.  Chauncey Cruel, MD   12/15/2014 4:37 PM

## 2015-06-10 ENCOUNTER — Ambulatory Visit
Admission: RE | Admit: 2015-06-10 | Discharge: 2015-06-10 | Disposition: A | Discharge status: Home / Self Care | Payer: Managed Care, Other (non HMO) | Source: Ambulatory Visit | Attending: Oncology | Admitting: Oncology

## 2015-06-10 ENCOUNTER — Other Ambulatory Visit: Payer: Self-pay | Admitting: Oncology

## 2015-06-10 DIAGNOSIS — C50412 Malignant neoplasm of upper-outer quadrant of left female breast: Secondary | ICD-10-CM

## 2015-06-18 ENCOUNTER — Other Ambulatory Visit: Payer: Self-pay | Admitting: Nurse Practitioner

## 2015-06-18 DIAGNOSIS — C50412 Malignant neoplasm of upper-outer quadrant of left female breast: Secondary | ICD-10-CM

## 2015-06-21 ENCOUNTER — Other Ambulatory Visit (HOSPITAL_BASED_OUTPATIENT_CLINIC_OR_DEPARTMENT_OTHER): Payer: Managed Care, Other (non HMO)

## 2015-06-21 ENCOUNTER — Telehealth: Payer: Self-pay | Admitting: Nurse Practitioner

## 2015-06-21 ENCOUNTER — Encounter: Payer: Self-pay | Admitting: Nurse Practitioner

## 2015-06-21 ENCOUNTER — Ambulatory Visit (HOSPITAL_BASED_OUTPATIENT_CLINIC_OR_DEPARTMENT_OTHER): Payer: Managed Care, Other (non HMO) | Admitting: Nurse Practitioner

## 2015-06-21 VITALS — BP 135/68 | HR 83 | Temp 98.3°F | Resp 18 | Wt 196.1 lb

## 2015-06-21 DIAGNOSIS — C50412 Malignant neoplasm of upper-outer quadrant of left female breast: Secondary | ICD-10-CM

## 2015-06-21 DIAGNOSIS — M8580 Other specified disorders of bone density and structure, unspecified site: Secondary | ICD-10-CM | POA: Insufficient documentation

## 2015-06-21 DIAGNOSIS — Z170 Estrogen receptor positive status [ER+]: Secondary | ICD-10-CM

## 2015-06-21 LAB — COMPREHENSIVE METABOLIC PANEL (CC13)
ALBUMIN: 3.8 g/dL (ref 3.5–5.0)
ALK PHOS: 84 U/L (ref 40–150)
ALT: 15 U/L (ref 0–55)
AST: 16 U/L (ref 5–34)
Anion Gap: 7 mEq/L (ref 3–11)
BILIRUBIN TOTAL: 0.48 mg/dL (ref 0.20–1.20)
BUN: 13.8 mg/dL (ref 7.0–26.0)
CO2: 26 meq/L (ref 22–29)
CREATININE: 1 mg/dL (ref 0.6–1.1)
Calcium: 9.5 mg/dL (ref 8.4–10.4)
Chloride: 109 mEq/L (ref 98–109)
EGFR: 62 mL/min/{1.73_m2} — AB (ref >90)
GLUCOSE: 90 mg/dL (ref 70–140)
Potassium: 4.2 mEq/L (ref 3.5–5.1)
SODIUM: 142 meq/L (ref 136–145)
TOTAL PROTEIN: 7.3 g/dL (ref 6.4–8.3)

## 2015-06-21 LAB — CBC WITH DIFFERENTIAL/PLATELET
BASO%: 1.1 % (ref 0.0–2.0)
Basophils Absolute: 0.1 10*3/uL (ref 0.0–0.1)
EOS ABS: 0 10*3/uL (ref 0.0–0.5)
EOS%: 0 % (ref 0.0–7.0)
HCT: 35.1 % (ref 34.8–46.6)
HEMOGLOBIN: 11.2 g/dL — AB (ref 11.6–15.9)
LYMPH%: 27.7 % (ref 14.0–49.7)
MCH: 24.7 pg — ABNORMAL LOW (ref 25.1–34.0)
MCHC: 32 g/dL (ref 31.5–36.0)
MCV: 77.2 fL — AB (ref 79.5–101.0)
MONO#: 0.6 10*3/uL (ref 0.1–0.9)
MONO%: 9 % (ref 0.0–14.0)
NEUT%: 62.2 % (ref 38.4–76.8)
NEUTROS ABS: 3.8 10*3/uL (ref 1.5–6.5)
Platelets: 286 10*3/uL (ref 145–400)
RBC: 4.54 10*6/uL (ref 3.70–5.45)
RDW: 14.7 % — AB (ref 11.2–14.5)
WBC: 6.2 10*3/uL (ref 3.9–10.3)
lymph#: 1.7 10*3/uL (ref 0.9–3.3)

## 2015-06-21 NOTE — Telephone Encounter (Signed)
Gave avs & calendar for February 2017. °

## 2015-06-21 NOTE — Progress Notes (Signed)
ID: Sally Morrison OB: 1951/01/31  MR#: [id number]  CSN#:[id number]  PCP: [name], MD GYN:   SU:  OTHER MD: the patient's team in [place]/ SU [phone]), [name]/ ROnc [phone]) and Shailja Roy/ [place] [phone])  CHIEF COMPLAINT:estrogen receptor positive Left Breast Cancer  CURRENT TREATMENT: Anastrozole   BREAST CANCER HISTORY: From the original intake note:  The patient noted a palpable lump in the 2:00 position of the left breast sometime January of 2013. She eventually brought it to medical attention and bilateral diagnostic mammography and left breast ultrasonography obtained [facility] [place] 01/30/2012 showed interval increase in nodular densities in the upper outer quadrant of the left breast which had been noted and evaluated previously in January of 2011 and November of 2010. Ultrasonography of the left breast showed a 1.4 cm hypoechoic solid lesion, with 2 additional lesions, all very close to each other, in the area of palpable concern... MRI of the left breast with and without contrast 02/23/2012 showed thick walled cystic lesions adjacent to each other in the upper outer quadrant of the left breast measuring altogether 2.2 cm. There was some right axillary adenopathy, the largest lymph node measuring 1.2 cm with "somewhat lobular contours".  On 04/19/2012 the patient underwent biopsy of the area in question in the left breast upper outer quadrant, showing an invasive ductal carcinoma, grade 1, estrogen receptor 90% positive, progesterone receptor 50% positive, with no HER-2 amplification and an Mib-1 of 9%. P53 was negative.. on 05/20/2012 the patient underwent left lumpectomy and sentinel lymph node sampling, with the final pathology confirming a 1.5 cm invasive ductal carcinoma, grade 1, estrogen receptor 100% positive, progesterone receptor 52% positive, with no HER-2 amplification and an MIB-1 of  8%. 0 of 3 sentinel lymph nodes were involved.  The patient's subsequent history is as detailed below.  INTERVAL HISTORY: Sally Morrison returns today for routine followup of her left breast carcinoma. She has been on anastrozole since December 2013 and is tolerating this drug well. She has occasional hot flashes, but she manages these on her own. She denies vaginal changes, and her arthritis is no worse than her baseline. The interval history is completely unremarkable.   REVIEW OF SYSTEMS: The patient denies fevers, chills, nausea, vomiting, or changes in bowel or bladder habits. she is eating and drinking well. her energy level is appropriate. she has no pain. she denies mouth sores, rashes, or neuropathy symptoms. she has no shortness of breath, chest pain, cough, or palpitations. she denies headaches, dizziness, or vision changes. her mood is stable. A detailed review of systems is otherwise stable.   PAST MEDICAL HISTORY: Past Medical History  Diagnosis Date  . Breast cancer   . Cancer   . Allergy   . Hypertension   . Anemia   . Sickle cell trait     PAST SURGICAL HISTORY: Past Surgical History  Procedure Laterality Date  . Breast surgery      left breast lumpectomy  . Abdominal hysterectomy    . Cholecystectomy    . Colonoscopy with propofol N/A 05/21/2014    Procedure: COLONOSCOPY WITH PROPOFOL;  Surgeon: Juanita Craver, MD;  Location: [facility];  Service: Endoscopy;  Laterality: N/A;   Status post hysterectomy and bilateral salpingo-oophorectomy, status post cholecystectomy, status post left lumpectomy and sentinel lymph node sampling  FAMILY HISTORY No family history on file. The patient's father died with dementia at the age of 9. The patient's mother died  at the age of 6 from a myocardial infarction. Sally Morrison history of rheumatoid arthritis. The patient had 1 full brother and one full sister, as well as several half siblings. There is no history of breast or ovarian cancer in the  immediate family.  GYNECOLOGIC HISTORY:   (Updated 12/23/2013) Menarche age 64, first live birth age 89, she is [place] P2. She had her total abdominal hysterectomy and bilateral salpingo-oophorectomy in 2003. She did not use hormone replacement . 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 She is a widow and recently moved to this area to live with her daughter. The patient's son Sally Morrison lives in [place], [place] and works as an Optometrist. The patient's daughter Sally Morrison teaches special ed here in [place]. The patient has 4 grandchildren. She attends "1 church for all".    ADVANCED DIRECTIVES: Not in place; at her initial visit on 07/30/2013 the patient was given healthcare power of attorney forms to complete and bring back so they can be scanned into her records.   HEALTH MAINTENANCE:  (Updated 12/23/2013) Social History  Substance Use Topics  . Smoking status: Former Smoker    Types: Cigarettes  . Smokeless tobacco: Not on file     Comment: smoked in College for 2-3 years  . Alcohol Use: No     Colonoscopy: 2011  PAP: June 2014  Bone density: July 2013  Lipid panel:  Not on file/[name]   Allergies  Allergen Reactions  . Penicillins Rash  . Aleve [Naproxen Sodium] Itching    Current Outpatient Prescriptions  Medication Sig Dispense Refill  . anastrozole (ARIMIDEX) 1 MG tablet Take 1 tablet (1 mg total) by mouth at bedtime. 90 tablet 4  . losartan-hydrochlorothiazide (HYZAAR) 50-12.5 MG per tablet Take 0.5 tablets by mouth every morning.      No current facility-administered medications for this visit.    OBJECTIVE: Middle-aged [place] American woman who appears stated age 28 Vitals:   06/21/15 1513  BP: 135/68  Pulse: 83  Temp: 98.3 F (36.8 C)  Resp: 18     Body mass index is 37.07 kg/(m^2).    ECOG FS:0 -  Asymptomatic Filed Weights   06/21/15 1513  Weight: 196 lb 1.6 oz (88.95 kg)   Skin: warm, dry  HEENT: sclerae anicteric, conjunctivae pink, oropharynx clear. No thrush or mucositis.  Lymph Nodes: No cervical or supraclavicular lymphadenopathy  Lungs: clear to auscultation bilaterally, no rales, wheezes, or rhonci  Heart: regular rate and rhythm  Abdomen: round, soft, non tender, positive bowel sounds  Musculoskeletal: No focal spinal tenderness, no peripheral edema  Neuro: non focal, well oriented, positive affect  Breasts: left breast status post lumpectomy and radiation. No evidence of recurrent disease. Left axilla benign. Right breast unremarkable.   LAB RESULTS:  CBC    Component Value Date/Time   WBC 6.2 06/21/2015 1448   RBC 4.54 06/21/2015 1448   HGB 11.2* 06/21/2015 1448   HCT 35.1 06/21/2015 1448   PLT 286 06/21/2015 1448   MCV 77.2* 06/21/2015 1448   MCH 24.7* 06/21/2015 1448   MCHC 32.0 06/21/2015 1448   RDW 14.7* 06/21/2015 1448   LYMPHSABS 1.7 06/21/2015 1448   MONOABS 0.6 06/21/2015 1448   EOSABS 0.0 06/21/2015 1448   BASOSABS 0.1 06/21/2015 1448    CMP     Component Value Date/Time   NA 142  06/21/2015 1448   K 4.2 06/21/2015 1448   CO2 26 06/21/2015 1448   GLUCOSE 90 06/21/2015 1448   BUN 13.8 06/21/2015 1448   CREATININE 1.0 06/21/2015 1448   CALCIUM 9.5 06/21/2015 1448   PROT 7.3 06/21/2015 1448   ALBUMIN 3.8 06/21/2015 1448   AST 16 06/21/2015 1448   ALT 15 06/21/2015 1448   ALKPHOS 84 06/21/2015 1448   BILITOT 0.48 06/21/2015 1448     STUDIES:  Dg Bone Density  06/10/2015   CLINICAL DATA:  Postmenopausal. Baseline exam. No hormone replacement therapy. Patient does have history of hysterectomy and oophorectomy  EXAM: DUAL X-RAY ABSORPTIOMETRY (DXA) FOR BONE MINERAL DENSITY  FINDINGS: AP LUMBAR SPINE L2 through L4  Bone Mineral Density (BMD):  0.819 g/cm2  Young Adult T-Score:  -2.4  Z-Score:  -1.6  LEFT FEMUR NECK  Bone Mineral Density  (BMD):  0.744 g/cm2  Young Adult T-Score: -0.9  Z-Score:  -0.3  ASSESSMENT: Patient's diagnostic category is LOW BONE MASS by WHO Criteria.  FRACTURE RISK: INCREASED  FRAX: 10 year fracture risk for major osteoporotic fracture is 3%. Ten year fracture risk for hip fracture is 0.1%.  COMPARISON: None.  Effective therapies are available in the form of bisphosphonates, selective estrogen receptor modulators, biologic agents, and hormone replacement therapy (for women). All patients should ensure an adequate intake of dietary calcium (1200 mg daily) and vitamin D (800 IU daily) unless contraindicated.  All treatment decisions require clinical judgment and consideration of individual patient factors, including patient preferences, co-morbidities, previous drug use, risk factors not captured in the FRAX model (e.g., frailty, falls, vitamin D deficiency, increased bone turnover, interval significant decline in bone density) and possible under- or over-estimation of fracture risk by FRAX.  The National Osteoporosis Foundation recommends that FDA-approved medical therapies be considered in postmenopausal women and men age 30 or older with a:  1. Hip or vertebral (clinical or morphometric) fracture.  2. T-score of -2.5 or lower at the spine or hip.  3. Ten-year fracture probability by FRAX of 3% or greater for hip fracture or 20% or greater for major osteoporotic fracture.  People with diagnosed cases of osteoporosis or at high risk for fracture should have regular bone mineral density tests. For patients eligible for Medicare, routine testing is allowed once every 2 years. The testing frequency can be increased to one year for patients who have rapidly progressing disease, those who are receiving or discontinuing medical therapy to restore bone mass, or have additional risk factors.  World Pharmac[facility]) Criteria:  Normal: T-scores from +1.0 to -1.0  Low Bone Mass (Osteopenia): T-scores between -1.0 and -2.5   Osteoporosis: T-scores -2.5 and below  Comparison to Reference Population:  T-score is the key measure used in the diagnosis of osteoporosis and relative risk determination for fracture. It provides a value for bone mass relative to the mean bone mass of a young adult reference population expressed in terms of standard deviation (SD).  Z-score is the age-matched score showing the patient's values compared to a population matched for age, sex, and race. This is also expressed in terms of standard deviation. The patient may have values that compare favorably to the age-matched values and still be at increased risk for fracture.   Electronically Signed   By: Franki Cabot M.D.   On: 06/10/2015 14:02   Mm Diag Breast Tomo Bilateral  06/10/2015   CLINICAL DATA:  History of left breast cancer in 2013 status post lumpectomy and  radiation therapy.  EXAM: DIGITAL DIAGNOSTIC BILATERAL MAMMOGRAM WITH 3D TOMOSYNTHESIS AND CAD  COMPARISON:  Previous exam(s).  ACR Breast Density Category b: There are scattered areas of fibroglandular density.  FINDINGS: There are stable postsurgical changes within the left breast. Scattered benign-appearing calcifications within each breast are unchanged. There are no new dominant masses, suspicious calcifications or secondary signs of malignancy identified in either breast.  Mammographic images were processed with CAD.  IMPRESSION: No mammographic evidence of malignancy. Stable postsurgical changes within the left breast.  RECOMMENDATION: Screening mammogram in one year.(Code:SM-B-01Y)  I have discussed the findings and recommendations with the patient. Results were also provided in writing at the conclusion of the visit. If applicable, a reminder letter will be sent to the patient regarding the next appointment.  BI-RADS CATEGORY  2: Benign.   Electronically Signed   By: Franki Cabot M.D.   On: 06/10/2015 09:54    ASSESSMENT: 64 y.o. [place] woman  (1) status post left lumpectomy  and sentinel lymph node biopsy 05/20/2012 for a pT1c pN0, stage IA invasive ductal carcinoma, grade 1, estrogen receptor 100% positive, progesterone receptor 52% positive, with no HER-2 amplification and an MIB-1 of 8%  (2) Oncotype DX score of 0 predicted a distant disease risk of recurrence over 10 years of approximately 3% if the patient's only systemic treatment was tamoxifen for 5 years. It also predicted no benefit from chemotherapy.  (3) the patient completed adjuvant radiation 09/17/2012, receiving 36 treatments to the left breast using medial and lateral tangential fields to a dose of 5040 cGy, followed by a boost to the postoperative bed of an additional 1440 cGy (given dose 6648).  (4) anastrozole started December 2013.  (5) osteopenia -- t-score -2.4 on 06/10/15. To begin Prolia every 6 months in Fall 2016 with dentist approval.   PLAN: Makaley is doing well as far as her breast cancer is concerned. She is now 3 years out from her definitive surgery with no evidence of recurrent disease. She is tolerating the anastrozole well and will continue this until December 2018 to complete 5 years of antiestrogen therapy.  I reviewed the results of her bone density scan, and her t-score is down to -2.4. She does not recall the results from her previous bone density scan from [place] 2 years ago, but "osteopenia" or "low bone mass" do not ring any bells with her. I advised her to begin a calcium/vitamin D supplement at once, and continue performing weight bearing activity which she is already frequently participating in. Because she is on active aromatase inhibitor therapy, and this can contribute to bone density loss, I believe she needs to be on a bone strengthening agent as well. We discussed several options, and she was most interested in prolia injections every 6 months. She knows a rare side effect of this drug can include osteonecrosis of the jaw. She is pending some fillings and tooth  extractions with her dentist this fall, so I have asked her to receive permission from him before we initiate this therapy. She will call us back with his approval when she obtains it   If it were not for the bone density issue, I would have moved Sally Morrison to yearly visits at this point. Instead, I will have her return in 6 months to ensure follow up and compliance with initiating the prolia. She understands and agrees with this plan. She knows the goal of treatment in her case is cure. She has been encouraged to call with  any issues that might arise before her next visit here.   Sally Panda, NP   06/21/2015 3:59 PM

## 2015-06-22 LAB — VITAMIN D 25 HYDROXY (VIT D DEFICIENCY, FRACTURES): Vit D, 25-Hydroxy: 31 ng/mL (ref 30–100)

## 2015-08-05 ENCOUNTER — Other Ambulatory Visit (HOSPITAL_COMMUNITY)
Admission: RE | Admit: 2015-08-05 | Discharge: 2015-08-05 | Disposition: A | Discharge status: Home / Self Care | Payer: Managed Care, Other (non HMO) | Source: Ambulatory Visit | Attending: Family Medicine | Admitting: Family Medicine

## 2015-08-05 ENCOUNTER — Other Ambulatory Visit: Payer: Self-pay | Admitting: Family Medicine

## 2015-08-05 DIAGNOSIS — Z1151 Encounter for screening for human papillomavirus (HPV): Secondary | ICD-10-CM | POA: Diagnosis present

## 2015-08-05 DIAGNOSIS — Z01419 Encounter for gynecological examination (general) (routine) without abnormal findings: Secondary | ICD-10-CM | POA: Insufficient documentation

## 2015-08-09 LAB — CYTOLOGY - PAP

## 2015-12-16 ENCOUNTER — Other Ambulatory Visit: Payer: Self-pay | Admitting: Oncology

## 2015-12-20 ENCOUNTER — Other Ambulatory Visit: Payer: Self-pay | Admitting: *Deleted

## 2015-12-20 DIAGNOSIS — C50412 Malignant neoplasm of upper-outer quadrant of left female breast: Secondary | ICD-10-CM

## 2015-12-21 ENCOUNTER — Encounter: Payer: Self-pay | Admitting: Oncology

## 2015-12-21 ENCOUNTER — Telehealth: Payer: Self-pay | Admitting: Oncology

## 2015-12-21 ENCOUNTER — Ambulatory Visit (HOSPITAL_BASED_OUTPATIENT_CLINIC_OR_DEPARTMENT_OTHER): Payer: Managed Care, Other (non HMO) | Admitting: Nurse Practitioner

## 2015-12-21 ENCOUNTER — Other Ambulatory Visit (HOSPITAL_BASED_OUTPATIENT_CLINIC_OR_DEPARTMENT_OTHER): Payer: Managed Care, Other (non HMO)

## 2015-12-21 VITALS — BP 127/56 | HR 75 | Temp 98.4°F | Resp 18 | Ht 61.0 in | Wt 204.4 lb

## 2015-12-21 DIAGNOSIS — M8580 Other specified disorders of bone density and structure, unspecified site: Secondary | ICD-10-CM | POA: Diagnosis not present

## 2015-12-21 DIAGNOSIS — C50412 Malignant neoplasm of upper-outer quadrant of left female breast: Secondary | ICD-10-CM

## 2015-12-21 LAB — CBC WITH DIFFERENTIAL/PLATELET
BASO%: 0.5 % (ref 0.0–2.0)
Basophils Absolute: 0 10*3/uL (ref 0.0–0.1)
EOS ABS: 0 10*3/uL (ref 0.0–0.5)
EOS%: 0 % (ref 0.0–7.0)
HCT: 35.9 % (ref 34.8–46.6)
HGB: 11.6 g/dL (ref 11.6–15.9)
LYMPH%: 24.6 % (ref 14.0–49.7)
MCH: 25 pg — ABNORMAL LOW (ref 25.1–34.0)
MCHC: 32.2 g/dL (ref 31.5–36.0)
MCV: 77.6 fL — AB (ref 79.5–101.0)
MONO#: 0.5 10*3/uL (ref 0.1–0.9)
MONO%: 7.5 % (ref 0.0–14.0)
NEUT%: 67.4 % (ref 38.4–76.8)
NEUTROS ABS: 4.1 10*3/uL (ref 1.5–6.5)
Platelets: 263 10*3/uL (ref 145–400)
RBC: 4.62 10*6/uL (ref 3.70–5.45)
RDW: 14.3 % (ref 11.2–14.5)
WBC: 6.2 10*3/uL (ref 3.9–10.3)
lymph#: 1.5 10*3/uL (ref 0.9–3.3)

## 2015-12-21 LAB — COMPREHENSIVE METABOLIC PANEL
ALT: 13 U/L (ref 0–55)
AST: 19 U/L (ref 5–34)
Albumin: 3.6 g/dL (ref 3.5–5.0)
Alkaline Phosphatase: 80 U/L (ref 40–150)
Anion Gap: 10 mEq/L (ref 3–11)
BILIRUBIN TOTAL: 0.57 mg/dL (ref 0.20–1.20)
BUN: 17.2 mg/dL (ref 7.0–26.0)
CHLORIDE: 107 meq/L (ref 98–109)
CO2: 22 meq/L (ref 22–29)
Calcium: 9.4 mg/dL (ref 8.4–10.4)
Creatinine: 1.1 mg/dL (ref 0.6–1.1)
EGFR: 51 mL/min/{1.73_m2} — AB (ref >90)
GLUCOSE: 133 mg/dL (ref 70–140)
POTASSIUM: 3.7 meq/L (ref 3.5–5.1)
SODIUM: 139 meq/L (ref 136–145)
TOTAL PROTEIN: 7.4 g/dL (ref 6.4–8.3)

## 2015-12-21 NOTE — Telephone Encounter (Signed)
Gave patient avs report and appointments for August.  °

## 2015-12-21 NOTE — Progress Notes (Signed)
ID: Sally Morrison OB: 1950-11-22  MR#: [id number]  CSN#:[id number]  PCP: Sally Peers, MD GYN:   SU:  OTHER MD: the patient's team in Sally Morrison/ SU [phone]), Sally Morrison/ ROnc [phone]) and Sally Morrison/ [place] [phone])  CHIEF COMPLAINT:estrogen receptor positive Left Breast Cancer  CURRENT TREATMENT: Anastrozole  BREAST CANCER HISTORY: From the original intake note:  The patient noted a palpable lump in the 2:00 position of the left breast sometime January of 2013. She eventually brought it to medical attention and bilateral diagnostic mammography and left breast [facility] [place] 01/30/2012 showed interval increase in nodular densities in the upper outer quadrant of the left breast which had been noted and evaluated previously in January of 2011 and November of 2010. Ultrasonography of the left breast showed a 1.4 cm hypoechoic solid lesion, with 2 additional lesions, all very close to each other, in the area of palpable concern... MRI of the left breast with and without contrast 02/23/2012 showed thick walled cystic lesions adjacent to each other in the upper outer quadrant of the left breast measuring altogether 2.2 cm. There was some right axillary adenopathy, the largest lymph node measuring 1.2 cm with "somewhat lobular contours".  On 04/19/2012 the patient underwent biopsy of the area in question in the left breast upper outer quadrant, showing an invasive ductal carcinoma, grade 1, estrogen receptor 90% positive, progesterone receptor 50% positive, with no HER-2 amplification and an Mib-1 of 9%. P53 was negative.. on 05/20/2012 the patient underwent left lumpectomy and sentinel lymph node sampling, with the final pathology confirming a 1.5 cm invasive ductal carcinoma, grade 1, estrogen receptor 100% positive, progesterone receptor 52% positive, with no HER-2 amplification and an MIB-1 of  8%. 0 of 3 sentinel lymph nodes were involved.  The patient's subsequent history is as detailed below.  INTERVAL HISTORY: Sally Morrison returns today for routine followup of her left breast cancer. She has been on anastrozole since December 2013 and is tolerates this well with few complaints. She has occasional hot flashes, but they are not severe enough to require additional meds. She denies vaginal changes, and her arthritis is no worse than her baseline.   REVIEW OF SYSTEMS: Sally Morrison is batting a head cold, but denies fevers or chills. She is stuffy with some congestion and a non productive cough. She has arthritis to her bilateral knees, but this has improved since her last visit as she is working with a Restaurant manager, fast food who injected "supartz," or a derivative of hyaluronic acid derived from rooster combs into the affected area. Her pain is greatly decreased. A detailed review of systems is otherwise entirely negative.   PAST MEDICAL HISTORY: Past Medical History  Diagnosis Date  . Breast cancer   . Cancer   . Allergy   . Hypertension   . Anemia   . Sickle cell trait     PAST SURGICAL HISTORY: Past Surgical History  Procedure Laterality Date  . Breast surgery      left breast lumpectomy  . Abdominal hysterectomy    . Cholecystectomy    . Colonoscopy with propofol N/A 05/21/2014    Procedure: COLONOSCOPY WITH PROPOFOL;  Surgeon: Sally Craver, MD;  Location: [facility];  Service: Endoscopy;  Laterality: N/A;   Status post hysterectomy and bilateral salpingo-oophorectomy, status post cholecystectomy, status post left lumpectomy and sentinel lymph node sampling  FAMILY HISTORY No family history on file. The patient's father died with dementia at the age  of 63. The patient's mother died at the age of 58 from a myocardial infarction. Sally Morrison history of rheumatoid arthritis. The patient had 1 full brother and one full sister, as well as several half siblings. There is no history of breast or ovarian  cancer in the immediate family.  GYNECOLOGIC HISTORY:   (Updated 12/23/2013) Menarche age 65, first live birth age 65, she is [place] P2. She had her total abdominal hysterectomy and bilateral salpingo-oophorectomy in 2003. She did not use hormone replacement . 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 She is a widow and recently moved to this area to live with her daughter. The patient's son Sally Morrison lives in upper [place], [place] and works as an Optometrist. The patient's daughter Sally Morrison teaches special ed here in [place]. The patient has 4 grandchildren. She attends "1 church for all".    ADVANCED DIRECTIVES: Not in place; at her initial visit on 07/30/2013 the patient was given healthcare power of attorney forms to complete and bring back so they can be scanned into her records.   HEALTH MAINTENANCE:  (Updated 12/23/2013) Social History  Substance Use Topics  . Smoking status: Former Smoker    Types: Cigarettes  . Smokeless tobacco: Not on file     Comment: smoked in College for 2-3 years  . Alcohol Use: No     Colonoscopy: 2011  PAP: June 2014  Bone density: July 2013  Lipid panel:  Not on file/Dr. Criss Morrison   Allergies  Allergen Reactions  . Penicillins Rash  . Aleve [Naproxen Sodium] Itching    Current Outpatient Prescriptions  Medication Sig Dispense Refill  . anastrozole (ARIMIDEX) 1 MG tablet TAKE 1 TABLET BY MOUTH AT BEDTIME 90 tablet 0  . losartan-hydrochlorothiazide (HYZAAR) 50-12.5 MG per tablet Take 0.5 tablets by mouth every morning.      No current facility-administered medications for this visit.    OBJECTIVE: Middle-aged [place] American woman who appears stated age 65 Vitals:   12/21/15 1320  BP: 127/56  Pulse: 75  Temp: 98.4 F (36.9 C)  Resp: 18     Body mass index is 38.64 kg/(m^2).    ECOG FS:0 -  Asymptomatic Filed Weights   12/21/15 1320  Weight: 204 lb 6.4 oz (92.715 kg)   Skin: warm, dry  HEENT: sclerae anicteric, conjunctivae pink, oropharynx clear. No thrush or mucositis.  Lymph Nodes: No cervical or supraclavicular lymphadenopathy  Lungs: clear to auscultation bilaterally, no rales, wheezes, or rhonci  Heart: regular rate and rhythm  Abdomen: round, soft, non tender, positive bowel sounds  Musculoskeletal: No focal spinal tenderness, no peripheral edema  Neuro: non focal, well oriented, positive affect  Breasts: left breast status post lumpectomy and radiation. No evidence of recurrent disease. Left axilla benign. Right breast unremarkable.   LAB RESULTS:  CBC    Component Value Date/Time   WBC 6.2 12/21/2015 1307   RBC 4.62 12/21/2015 1307   HGB 11.6 12/21/2015 1307   HCT 35.9 12/21/2015 1307   PLT 263 12/21/2015 1307   MCV 77.6* 12/21/2015 1307   MCH 25.0* 12/21/2015 1307   MCHC 32.2 12/21/2015 1307   RDW 14.3 12/21/2015 1307   LYMPHSABS 1.5 12/21/2015 1307   MONOABS 0.5 12/21/2015 1307   EOSABS 0.0 12/21/2015 1307   BASOSABS 0.0 12/21/2015 1307    CMP     Component Value Date/Time  NA 139 12/21/2015 1307   K 3.7 12/21/2015 1307   CO2 22 12/21/2015 1307   GLUCOSE 133 12/21/2015 1307   BUN 17.2 12/21/2015 1307   CREATININE 1.1 12/21/2015 1307   CALCIUM 9.4 12/21/2015 1307   PROT 7.4 12/21/2015 1307   ALBUMIN 3.6 12/21/2015 1307   AST 19 12/21/2015 1307   ALT 13 12/21/2015 1307   ALKPHOS 80 12/21/2015 1307   BILITOT 0.57 12/21/2015 1307     STUDIES:  No results found.  ASSESSMENT: 65 y.o. [place] woman  (1) status post left lumpectomy and sentinel lymph node biopsy 05/20/2012 for a pT1c pN0, stage IA invasive ductal carcinoma, grade 1, estrogen receptor 100% positive, progesterone receptor 52% positive, with no HER-2 amplification and an MIB-1 of 8%  (2) Oncotype DX score of 0 predicted a distant disease risk of recurrence over 10 years  of approximately 3% if the patient's only systemic treatment was tamoxifen for 5 years. It also predicted no benefit from chemotherapy.  (3) the patient completed adjuvant radiation 09/17/2012, receiving 36 treatments to the left breast using medial and lateral tangential fields to a dose of 5040 cGy, followed by a boost to the postoperative bed of an additional 1440 cGy (given dose 6648).  (4) anastrozole started December 2013.  (5) osteopenia -- t-score -2.4 on 06/10/15. To begin Prolia every 6 months in late Summer 2017 with dentist approval.   PLAN: Nikaela is doing well as far as her breast cancer is concerned. She is now 3.5  years out from her definitive surgery with no evidence of recurrent disease. She continues to tolerate the anastrozole well. December 2018 will be the 5 year mark from this drug, but she is in a breast cancer survivor's group where some of her friends are continuing on for 10 years. She wonders if this will be an option for her. I believe this will come down to her bone density score. She is severely osteopenic, almost osteoporotic on anastrozole which is only making the situation worse. She has not initiated Prolia injections yet because she has not had the required teeth extracted. She knows a rare side effect of this drug can include osteonecrosis of the jaw. She plans on having them removed this summer, and will call us with her dentist's approval. In the meantime she will continue on a vitamin D supplement with plenty of weight bearing exercise. She could also switch to tamoxifen for the final 5 years if she was truly interested in being treated for a total of 10 years.  If it were not for the bone density issue, I would have moved [place] to yearly visits at this point. Instead, I will have her return in 6 months to ensure follow up and compliance with initiating the prolia. Prior to this visit she will have a repeat mammogram. She understands and agrees with this plan. She  knows the goal of treatment in her case is cure. She has been encouraged to call with any issues that might arise before her next visit here.  Laurie Panda, NP   12/21/2015 2:07 PM

## 2016-03-16 ENCOUNTER — Other Ambulatory Visit: Payer: Self-pay | Admitting: Oncology

## 2016-04-03 ENCOUNTER — Other Ambulatory Visit: Payer: Self-pay | Admitting: Oncology

## 2016-04-03 DIAGNOSIS — Z1231 Encounter for screening mammogram for malignant neoplasm of breast: Secondary | ICD-10-CM

## 2016-06-13 ENCOUNTER — Ambulatory Visit: Payer: Self-pay

## 2016-06-14 ENCOUNTER — Ambulatory Visit
Admission: RE | Admit: 2016-06-14 | Discharge: 2016-06-14 | Disposition: A | Discharge status: Home / Self Care | Payer: Managed Care, Other (non HMO) | Source: Ambulatory Visit | Attending: Oncology | Admitting: Oncology

## 2016-06-14 ENCOUNTER — Other Ambulatory Visit: Payer: Self-pay | Admitting: Oncology

## 2016-06-14 DIAGNOSIS — Z1231 Encounter for screening mammogram for malignant neoplasm of breast: Secondary | ICD-10-CM

## 2016-06-14 DIAGNOSIS — Z853 Personal history of malignant neoplasm of breast: Secondary | ICD-10-CM

## 2016-06-18 ENCOUNTER — Other Ambulatory Visit: Payer: Self-pay | Admitting: Oncology

## 2016-06-19 ENCOUNTER — Other Ambulatory Visit: Payer: Self-pay | Admitting: *Deleted

## 2016-06-19 DIAGNOSIS — C50412 Malignant neoplasm of upper-outer quadrant of left female breast: Secondary | ICD-10-CM

## 2016-06-19 NOTE — Telephone Encounter (Signed)
Chart reviewed.

## 2016-06-20 ENCOUNTER — Telehealth: Payer: Self-pay | Admitting: Oncology

## 2016-06-20 ENCOUNTER — Ambulatory Visit: Payer: Managed Care, Other (non HMO)

## 2016-06-20 ENCOUNTER — Ambulatory Visit (HOSPITAL_BASED_OUTPATIENT_CLINIC_OR_DEPARTMENT_OTHER): Payer: Managed Care, Other (non HMO) | Admitting: Oncology

## 2016-06-20 ENCOUNTER — Other Ambulatory Visit (HOSPITAL_BASED_OUTPATIENT_CLINIC_OR_DEPARTMENT_OTHER): Payer: Managed Care, Other (non HMO)

## 2016-06-20 VITALS — BP 134/66 | HR 79 | Temp 98.9°F | Resp 18 | Ht 61.0 in | Wt 209.2 lb

## 2016-06-20 DIAGNOSIS — C773 Secondary and unspecified malignant neoplasm of axilla and upper limb lymph nodes: Secondary | ICD-10-CM | POA: Diagnosis not present

## 2016-06-20 DIAGNOSIS — C50412 Malignant neoplasm of upper-outer quadrant of left female breast: Secondary | ICD-10-CM | POA: Diagnosis not present

## 2016-06-20 DIAGNOSIS — M79662 Pain in left lower leg: Secondary | ICD-10-CM

## 2016-06-20 DIAGNOSIS — M8580 Other specified disorders of bone density and structure, unspecified site: Secondary | ICD-10-CM

## 2016-06-20 DIAGNOSIS — M79661 Pain in right lower leg: Secondary | ICD-10-CM

## 2016-06-20 DIAGNOSIS — N951 Menopausal and female climacteric states: Secondary | ICD-10-CM

## 2016-06-20 DIAGNOSIS — Z87891 Personal history of nicotine dependence: Secondary | ICD-10-CM

## 2016-06-20 LAB — CBC WITH DIFFERENTIAL/PLATELET
BASO%: 0.3 % (ref 0.0–2.0)
BASOS ABS: 0 10*3/uL (ref 0.0–0.1)
EOS%: 0 % (ref 0.0–7.0)
Eosinophils Absolute: 0 10*3/uL (ref 0.0–0.5)
HEMATOCRIT: 34.9 % (ref 34.8–46.6)
HGB: 11.1 g/dL — ABNORMAL LOW (ref 11.6–15.9)
LYMPH#: 1.3 10*3/uL (ref 0.9–3.3)
LYMPH%: 19.2 % (ref 14.0–49.7)
MCH: 24.2 pg — AB (ref 25.1–34.0)
MCHC: 31.9 g/dL (ref 31.5–36.0)
MCV: 75.8 fL — ABNORMAL LOW (ref 79.5–101.0)
MONO#: 0.6 10*3/uL (ref 0.1–0.9)
MONO%: 8.8 % (ref 0.0–14.0)
NEUT#: 4.8 10*3/uL (ref 1.5–6.5)
NEUT%: 71.7 % (ref 38.4–76.8)
Platelets: 261 10*3/uL (ref 145–400)
RBC: 4.6 10*6/uL (ref 3.70–5.45)
RDW: 14.2 % (ref 11.2–14.5)
WBC: 6.6 10*3/uL (ref 3.9–10.3)

## 2016-06-20 LAB — COMPREHENSIVE METABOLIC PANEL
ALT: 13 U/L (ref 0–55)
AST: 17 U/L (ref 5–34)
Albumin: 3.3 g/dL — ABNORMAL LOW (ref 3.5–5.0)
Alkaline Phosphatase: 85 U/L (ref 40–150)
Anion Gap: 9 mEq/L (ref 3–11)
BUN: 15.5 mg/dL (ref 7.0–26.0)
CALCIUM: 9.6 mg/dL (ref 8.4–10.4)
CHLORIDE: 108 meq/L (ref 98–109)
CO2: 27 mEq/L (ref 22–29)
Creatinine: 1 mg/dL (ref 0.6–1.1)
EGFR: 58 mL/min/{1.73_m2} — AB (ref >90)
GLUCOSE: 97 mg/dL (ref 70–140)
POTASSIUM: 3.6 meq/L (ref 3.5–5.1)
Sodium: 143 mEq/L (ref 136–145)
Total Bilirubin: 0.59 mg/dL (ref 0.20–1.20)
Total Protein: 7.3 g/dL (ref 6.4–8.3)

## 2016-06-20 MED ORDER — ANASTROZOLE 1 MG PO TABS: 1.0000 mg | ORAL_TABLET | Freq: Every day | ORAL | 4 refills | Status: DC

## 2016-06-20 MED ORDER — GABAPENTIN 300 MG PO CAPS: 300.0000 mg | ORAL_CAPSULE | Freq: Every day | ORAL | 4 refills | Status: DC

## 2016-06-20 NOTE — Progress Notes (Signed)
ID: Sally Morrison OB: August 15, 1951  MR#: [id number]  CSN#:[id number]  PCP: [name], MD GYN:   SU:  OTHER MD: the patient's team in [place]/ SU [phone]), [name]/ ROnc [phone]) and Shailja Roy/ [place] [phone])  CHIEF COMPLAINT:estrogen receptor positive Left Breast Cancer  CURRENT TREATMENT: Anastrozole  BREAST CANCER HISTORY: From the original intake note:  The patient noted a palpable lump in the 2:00 position of the left breast sometime January of 2013. She eventually brought it to medical attention and bilateral diagnostic mammography and left breast ultrasonography obtained at [facility] [place] 01/30/2012 showed interval increase in nodular densities in the upper outer quadrant of the left breast which had been noted and evaluated previously in January of 2011 and November of 2010. Ultrasonography of the left breast showed a 1.4 cm hypoechoic solid lesion, with 2 additional lesions, all very close to each other, in the area of palpable concern... MRI of the left breast with and without contrast 02/23/2012 showed thick walled cystic lesions adjacent to each other in the upper outer quadrant of the left breast measuring altogether 2.2 cm. There was some right axillary adenopathy, the largest lymph node measuring 1.2 cm with "somewhat lobular contours".  On 04/19/2012 the patient underwent biopsy of the area in question in the left breast upper outer quadrant, showing an invasive ductal carcinoma, grade 1, estrogen receptor 90% positive, progesterone receptor 50% positive, with no HER-2 amplification and an Mib-1 of 9%. P53 was negative.. on 05/20/2012 the patient underwent left lumpectomy and sentinel lymph node sampling, with the final pathology confirming a 1.5 cm invasive ductal carcinoma, grade 1, estrogen receptor 100% positive, progesterone receptor 52% positive, with no HER-2 amplification and an MIB-1 of  8%. 0 of 3 sentinel lymph nodes were involved.  The patient's subsequent history is as detailed below.  INTERVAL HISTORY: Sally Morrison returns today for routine followup of her estrogen receptor positive breast cancer. She continues on anastrozole, generally with good tolerance. She does have hot flashes which are intermittent and can occur at night. They don't wake her up frequently. She thinks the anastrozole is making her osteoarthritis worse particularly in the lower legs and particularly at night. Vaginal dryness is not an issue. She obtains the drug sent a good price.   REVIEW OF SYSTEMS: Sally Morrison has noted a little bit of blood on the tissue sometimes when she has large bowel movements. She has brought this to her primary care physician's attention and [name] is looking into when her next colonoscopy may be scheduled. The patient also has a little bit of a sore throat, and some soreness in the left breast. The patient exercises chiefly by going to [employer] and doing some weights as well as the water aerobics. Aside from these issues a detailed review of systems today was benign.  PAST MEDICAL HISTORY: Past Medical History:  Diagnosis Date  . Allergy   . Anemia   . Breast cancer ([place])   . Cancer ([place])   . Hypertension   . Sickle cell trait ([place])     PAST SURGICAL HISTORY: Past Surgical History:  Procedure Laterality Date  . ABDOMINAL HYSTERECTOMY    . BREAST SURGERY     left breast lumpectomy  . CHOLECYSTECTOMY    . COLONOSCOPY WITH PROPOFOL N/A 05/21/2014   Procedure: COLONOSCOPY WITH PROPOFOL;  Surgeon: Juanita Craver, MD;  Location: [facility];  Service: Endoscopy;  Laterality: N/A;   Status post hysterectomy  and bilateral salpingo-oophorectomy, status post cholecystectomy, status post left lumpectomy and sentinel lymph node sampling  FAMILY HISTORY No family history on file. The patient's father died with dementia at the age of 55. The patient's mother died at the age of 12  from a myocardial infarction. Sally Morrison history of rheumatoid arthritis. The patient had 1 full brother and one full sister, as well as several half siblings. There is no history of breast or ovarian cancer in the immediate family.  GYNECOLOGIC HISTORY:   (Updated 12/23/2013) Menarche age 63, first live birth age 89, she is Sally Morrison P2. She had her total abdominal hysterectomy and bilateral salpingo-oophorectomy in 2003. She did not use hormone replacement . 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 She is a widow and recently moved to this area to live with her daughter. The patient's son Sally Morrison lives in [place], [place] and works as an Optometrist. The patient's daughter Sally Morrison teaches special ed here in [place]. The patient has 4 grandchildren. She attends "1 church for all".    ADVANCED DIRECTIVES: Not in place; at her initial visit on 07/30/2013 the patient was given healthcare power of attorney forms to complete and bring back so they can be scanned into her records.   HEALTH MAINTENANCE:  (Updated 12/23/2013) Social History  Substance Use Topics  . Smoking status: Former Smoker    Types: Cigarettes  . Smokeless tobacco: Not on file     Comment: smoked in College for 2-3 years  . Alcohol use No     Colonoscopy: 2011  PAP: June 2014  Bone density: July 2013  Lipid panel:  Not on file/[name]   Allergies  Allergen Reactions  . Penicillins Rash  . Aleve [Naproxen Sodium] Itching    Current Outpatient Prescriptions  Medication Sig Dispense Refill  . anastrozole (ARIMIDEX) 1 MG tablet Take 1 tablet (1 mg total) by mouth at bedtime. 90 tablet 4  . losartan-hydrochlorothiazide (HYZAAR) 50-12.5 MG per tablet Take 0.5 tablets by mouth every morning.      No current facility-administered medications for this visit.     OBJECTIVE:  Middle-aged [place] American woman In no acute distress Vitals:   06/20/16 1408  BP: 134/66  Pulse: 79  Resp: 18  Temp: 98.9 F (37.2 C)     Body mass index is 39.53 kg/m.    ECOG FS:1 - Symptomatic but completely ambulatory Filed Weights   06/20/16 1408  Weight: 209 lb 3.2 oz (94.9 kg)   Sclerae unicteric, pupils round and equal Oropharynx clear and moist-- no thrush or other lesions No cervical or supraclavicular adenopathy Lungs no rales or rhonchi Heart regular rate and rhythm Abd soft, obese, nontender, positive bowel sounds MSK no focal spinal tenderness, no upper extremity lymphedema Neuro: nonfocal, well oriented, appropriate affect Breasts: The right breast is unremarkable. The left breast is status post lumpectomy and radiation. There is no evidence of local recurrence. Left axilla is benign. He     LAB RESULTS:  CBC    Component Value Date/Time   WBC 6.6 06/20/2016 1328   RBC 4.60 06/20/2016 1328   HGB 11.1 (L) 06/20/2016 1328   HCT 34.9 06/20/2016 1328   PLT 261 06/20/2016 1328   MCV 75.8 (L) 06/20/2016 1328   MCH 24.2 (L) 06/20/2016 1328   MCHC 31.9 06/20/2016 1328   RDW 14.2 06/20/2016 1328  LYMPHSABS 1.3 06/20/2016 1328   MONOABS 0.6 06/20/2016 1328   EOSABS 0.0 06/20/2016 1328   BASOSABS 0.0 06/20/2016 1328    CMP     Component Value Date/Time   NA 143 06/20/2016 1328   K 3.6 06/20/2016 1328   CO2 27 06/20/2016 1328   GLUCOSE 97 06/20/2016 1328   BUN 15.5 06/20/2016 1328   CREATININE 1.0 06/20/2016 1328   CALCIUM 9.6 06/20/2016 1328   PROT 7.3 06/20/2016 1328   ALBUMIN 3.3 (L) 06/20/2016 1328   AST 17 06/20/2016 1328   ALT 13 06/20/2016 1328   ALKPHOS 85 06/20/2016 1328   BILITOT 0.59 06/20/2016 1328     STUDIES:  Mm Diag Breast Tomo Bilateral  Result Date: 06/14/2016 CLINICAL DATA:  Left lumpectomy.  Annual mammography. EXAM: 2D DIGITAL DIAGNOSTIC BILATERAL MAMMOGRAM WITH CAD AND ADJUNCT TOMO COMPARISON:  Previous exam(s). ACR  Breast Density Category b: There are scattered areas of fibroglandular density. FINDINGS: The left lumpectomy site is stable. An asymmetry in the medial right breast has been present and unchanged for many years. No interval changes or suspicious findings. Mammographic images were processed with CAD. IMPRESSION: No mammographic evidence of malignancy RECOMMENDATION: Annual diagnostic mammography I have discussed the findings and recommendations with the patient. Results were also provided in writing at the conclusion of the visit. If applicable, a reminder letter will be sent to the patient regarding the next appointment. BI-RADS CATEGORY  2: Benign. Electronically Signed   By: Sally Morrison M.D   On: 06/14/2016 10:05    ASSESSMENT: 65 y.o. Sally Morrison woman  (1) status post left upper outer quadrant lumpectomy and sentinel lymph node biopsy 05/20/2012 for a pT1c pN0, stage IA invasive ductal carcinoma, grade 1, estrogen receptor 100% positive, progesterone receptor 52% positive, with no HER-2 amplification and an MIB-1 of 8%  (2) Oncotype DX score of 0 predicted a distant disease risk of recurrence over 10 years of approximately 3% if the patient's only systemic treatment was tamoxifen for 5 years. It also predicted no benefit from chemotherapy.  (3) the patient completed adjuvant radiation 09/17/2012, receiving 36 treatments to the left breast using medial and lateral tangential fields to a dose of 5040 cGy, followed by a boost to the postoperative bed of an additional 1440 cGy (given dose 6648).  (4) anastrozole started December 2013.  (5) osteopenia -- t-score -2.4 on 06/10/15.   (a) patient opted against Prolia  PLAN: Deajah is now 4 years out from definitive surgery for her breast cancer with no evidence of disease recurrence. This is very favorable.  She is tolerating the anastrozole generally well. She is having some nighttime hot flashes and some discomfort in her lower legs chiefly  at night, which may or may not be related to the anastrozole. I think she would benefit from nighttime gabapentin and I went ahead and wrote her a prescription after discussing the possible toxicities, side effects and complications with her. I think this will help both the hot flashes and the discomfort as well as helping her fall sleep  She decided against Prolia. I suggested she do take vitamin D and start a walking program. She will have a repeat bone density with her next mammogram August 2018. She will see me a couple of weeks after that. At that point she will be ready to "graduate" from breast cancer follow-up  She knows to call for any problems that may develop before the next visit.   Henrene Pastor, MD  06/20/2016 2:21 PM

## 2016-06-20 NOTE — Telephone Encounter (Signed)
appt made and avs printed °

## 2017-04-26 ENCOUNTER — Other Ambulatory Visit: Payer: Self-pay | Admitting: Family Medicine

## 2017-04-26 DIAGNOSIS — Z9889 Other specified postprocedural states: Secondary | ICD-10-CM

## 2017-06-19 ENCOUNTER — Ambulatory Visit
Admission: RE | Admit: 2017-06-19 | Discharge: 2017-06-19 | Disposition: A | Discharge status: Home / Self Care | Payer: 59 | Source: Ambulatory Visit | Attending: Family Medicine | Admitting: Family Medicine

## 2017-06-19 DIAGNOSIS — Z9889 Other specified postprocedural states: Secondary | ICD-10-CM

## 2017-06-19 HISTORY — DX: Personal history of irradiation: Z92.3

## 2017-07-03 ENCOUNTER — Other Ambulatory Visit (HOSPITAL_BASED_OUTPATIENT_CLINIC_OR_DEPARTMENT_OTHER): Payer: 59

## 2017-07-03 DIAGNOSIS — C50412 Malignant neoplasm of upper-outer quadrant of left female breast: Secondary | ICD-10-CM | POA: Diagnosis not present

## 2017-07-03 DIAGNOSIS — C773 Secondary and unspecified malignant neoplasm of axilla and upper limb lymph nodes: Secondary | ICD-10-CM | POA: Diagnosis not present

## 2017-07-03 LAB — CBC WITH DIFFERENTIAL/PLATELET
BASO%: 0 % (ref 0.0–2.0)
Basophils Absolute: 0 10*3/uL (ref 0.0–0.1)
EOS%: 0 % (ref 0.0–7.0)
Eosinophils Absolute: 0 10*3/uL (ref 0.0–0.5)
HCT: 35.1 % (ref 34.8–46.6)
HGB: 11.2 g/dL — ABNORMAL LOW (ref 11.6–15.9)
LYMPH#: 1.3 10*3/uL (ref 0.9–3.3)
LYMPH%: 22.5 % (ref 14.0–49.7)
MCH: 24.6 pg — ABNORMAL LOW (ref 25.1–34.0)
MCHC: 31.9 g/dL (ref 31.5–36.0)
MCV: 77 fL — ABNORMAL LOW (ref 79.5–101.0)
MONO#: 0.4 10*3/uL (ref 0.1–0.9)
MONO%: 7.6 % (ref 0.0–14.0)
NEUT%: 69.9 % (ref 38.4–76.8)
NEUTROS ABS: 3.9 10*3/uL (ref 1.5–6.5)
PLATELETS: 236 10*3/uL (ref 145–400)
RBC: 4.56 10*6/uL (ref 3.70–5.45)
RDW: 14.6 % — ABNORMAL HIGH (ref 11.2–14.5)
WBC: 5.6 10*3/uL (ref 3.9–10.3)

## 2017-07-03 LAB — COMPREHENSIVE METABOLIC PANEL
ALT: 12 U/L (ref 0–55)
ANION GAP: 8 meq/L (ref 3–11)
AST: 16 U/L (ref 5–34)
Albumin: 3.4 g/dL — ABNORMAL LOW (ref 3.5–5.0)
Alkaline Phosphatase: 92 U/L (ref 40–150)
BILIRUBIN TOTAL: 0.65 mg/dL (ref 0.20–1.20)
BUN: 19.1 mg/dL (ref 7.0–26.0)
CO2: 24 meq/L (ref 22–29)
CREATININE: 1 mg/dL (ref 0.6–1.1)
Calcium: 9.7 mg/dL (ref 8.4–10.4)
Chloride: 110 mEq/L — ABNORMAL HIGH (ref 98–109)
EGFR: 58 mL/min/{1.73_m2} — ABNORMAL LOW (ref >90)
GLUCOSE: 93 mg/dL (ref 70–140)
Potassium: 4.1 mEq/L (ref 3.5–5.1)
Sodium: 142 mEq/L (ref 136–145)
TOTAL PROTEIN: 7.1 g/dL (ref 6.4–8.3)

## 2017-07-04 LAB — VITAMIN D 25 HYDROXY (VIT D DEFICIENCY, FRACTURES): Vitamin D, 25-Hydroxy: 54.5 ng/mL (ref 30.0–100.0)

## 2017-07-10 ENCOUNTER — Telehealth: Payer: Self-pay | Admitting: Oncology

## 2017-07-10 ENCOUNTER — Ambulatory Visit (HOSPITAL_BASED_OUTPATIENT_CLINIC_OR_DEPARTMENT_OTHER): Payer: 59 | Admitting: Oncology

## 2017-07-10 VITALS — BP 116/51 | HR 72 | Temp 98.3°F | Resp 19 | Ht 61.0 in | Wt 212.2 lb

## 2017-07-10 DIAGNOSIS — C773 Secondary and unspecified malignant neoplasm of axilla and upper limb lymph nodes: Secondary | ICD-10-CM

## 2017-07-10 DIAGNOSIS — Z170 Estrogen receptor positive status [ER+]: Secondary | ICD-10-CM | POA: Diagnosis not present

## 2017-07-10 DIAGNOSIS — M8580 Other specified disorders of bone density and structure, unspecified site: Secondary | ICD-10-CM | POA: Diagnosis not present

## 2017-07-10 DIAGNOSIS — C50412 Malignant neoplasm of upper-outer quadrant of left female breast: Secondary | ICD-10-CM | POA: Diagnosis not present

## 2017-07-10 NOTE — Progress Notes (Signed)
ID: Sally Morrison OB: 1950/10/28  MR#: [id number]  CSN#:[id number]  PCP: Lucianne Lei, MD GYN:   SU:  OTHER MD: the patient's team in [place]/ SU [phone]), [name]/ ROnc [phone]) and [name]/ [place] [phone])  CHIEF COMPLAINT:estrogen receptor positive Left Breast Cancer  CURRENT TREATMENT: Completing 5 years of anastrozole Anastrozole  BREAST CANCER HISTORY: From the original intake note:  The patient noted a palpable lump in the 2:00 position of the left breast sometime January of 2013. She eventually brought it to medical attention and bilateral diagnostic mammography and left breast [facility] [place] 01/30/2012 showed interval increase in nodular densities in the upper outer quadrant of the left breast which had been noted and evaluated previously in January of 2011 and November of 2010. Ultrasonography of the left breast showed a 1.4 cm hypoechoic solid lesion, with 2 additional lesions, all very close to each other, in the area of palpable concern... MRI of the left breast with and without contrast 02/23/2012 showed thick walled cystic lesions adjacent to each other in the upper outer quadrant of the left breast measuring altogether 2.2 cm. There was some right axillary adenopathy, the largest lymph node measuring 1.2 cm with "somewhat lobular contours".  On 04/19/2012 the patient underwent biopsy of the area in question in the left breast upper outer quadrant, showing an invasive ductal carcinoma, grade 1, estrogen receptor 90% positive, progesterone receptor 50% positive, with no HER-2 amplification and an Mib-1 of 9%. P53 was negative.. on 05/20/2012 the patient underwent left lumpectomy and sentinel lymph node sampling, with the final pathology confirming a 1.5 cm invasive ductal carcinoma, grade 1, estrogen receptor 100% positive, progesterone receptor 52% positive, with no  HER-2 amplification and an MIB-1 of 8%. 0 of 3 sentinel lymph nodes were involved.  The patient's subsequent history is as detailed below.  INTERVAL HISTORY: Sally Morrison returns today for follow-up and treatment of her estrogen receptor positive breast cancer. She continues on anastrozole, generally with good tolerance.  Hot flashes and vaginal dryness are not a major issue. She never developed the arthralgias or myalgias that many patients can experience on this medication. She obtains it at a good price.  REVIEW OF SYSTEMS: Sally Morrison exercises about 3 times a week, one of those times this with Silver sneakers, a couple of other times it at her church. She complains of occasional soreness or sensitivity in her surgical breast. A detailed review of systems today was otherwise entirely stable  PAST MEDICAL HISTORY: Past Medical History:  Diagnosis Date  . Allergy   . Anemia   . Breast cancer ([place])   . Cancer ([place])   . Hypertension   . Personal history of radiation therapy 2013  . Sickle cell trait ([place])     PAST SURGICAL HISTORY: Past Surgical History:  Procedure Laterality Date  . ABDOMINAL HYSTERECTOMY    . BREAST BIOPSY Left 04/19/2012   malignant  . BREAST LUMPECTOMY Left 05/20/2012  . BREAST SURGERY     left breast lumpectomy  . CHOLECYSTECTOMY    . COLONOSCOPY WITH PROPOFOL N/A 05/21/2014   Procedure: COLONOSCOPY WITH PROPOFOL;  Surgeon: Juanita Craver, MD;  Location: [facility];  Service: Endoscopy;  Laterality: N/A;   Status post hysterectomy and bilateral salpingo-oophorectomy, status post cholecystectomy, status post left lumpectomy and sentinel lymph node sampling  FAMILY HISTORY No family history on file. The patient's father died with dementia at the age of 74. The  patient's mother died at the age of 65 from a myocardial infarction. [name] history of rheumatoid arthritis. The patient had 1 full brother and one full sister, as well as several half siblings. There is no history of  breast or ovarian cancer in the immediate family.  GYNECOLOGIC HISTORY:   (Updated 12/23/2013) Menarche age 17, first live birth age 89, she is [place] P2. She had her total abdominal hysterectomy and bilateral salpingo-oophorectomy in 2003. She did not use hormone replacement . 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 She is a widow and recently moved to this area to live with her daughter. She is retired but works at her church about 3 days a week. The patient's son Sally Morrison lives in [place], [place] and works as an Optometrist. The patient's daughter Sally Morrison teaches special ed here in [place]. The patient has 4 grandchildren. She attends "[employer] church for all".    ADVANCED DIRECTIVES: Not in place; at her initial visit on 07/30/2013 the patient was given healthcare power of attorney forms to complete and bring back so they can be scanned into her records.   HEALTH MAINTENANCE:  (Updated 12/23/2013) Social History  Substance Use Topics  . Smoking status: Former Smoker    Types: Cigarettes  . Smokeless tobacco: Not on file     Comment: smoked in College for 2-3 years  . Alcohol use No     Colonoscopy: 2011  PAP: June 2014  Bone density: July 2013  Lipid panel:  Not on file/[name]   Allergies  Allergen Reactions  . Penicillins Rash  . Aleve [Naproxen Sodium] Itching    Current Outpatient Prescriptions  Medication Sig Dispense Refill  . anastrozole (ARIMIDEX) 1 MG tablet Take 1 tablet (1 mg total) by mouth at bedtime. 90 tablet 4  . gabapentin (NEURONTIN) 300 MG capsule Take 1 capsule (300 mg total) by mouth at bedtime. 90 capsule 4  . losartan-hydrochlorothiazide (HYZAAR) 50-12.5 MG per tablet Take 0.5 tablets by mouth every morning.      No current facility-administered medications for this visit.     OBJECTIVE: Middle-aged  African American woman   Vitals:   07/10/17 0855  BP: (!) 116/51  Pulse: 72  Resp: 19  Temp: 98.3 F (36.8 C)  SpO2: 100%     Body mass index is 40.09 kg/m.    ECOG FS:0 - Asymptomatic Filed Weights   07/10/17 0855  Weight: 212 lb 3.2 oz (96.3 kg)   Sclerae unicteric, EOMs intact Oropharynx clear and moist No cervical or supraclavicular adenopathy Lungs no rales or rhonchi Heart regular rate and rhythm Abd soft, nontender, positive bowel sounds MSK no focal spinal tenderness, no upper extremity lymphedema Neuro: nonfocal, well oriented, appropriate affect Breasts: The right breast is benign. The left breast is status post lumpectomy and radiation. There is no evidence of local recurrence. Both axillae are benign.  LAB RESULTS:  CBC    Component Value Date/Time   WBC 5.6 07/03/2017 1007   RBC 4.56 07/03/2017 1007   HGB 11.2 (L) 07/03/2017 1007   HCT 35.1 07/03/2017 1007   PLT 236 07/03/2017 1007   MCV 77.0 (L) 07/03/2017 1007   MCH 24.6 (L) 07/03/2017 1007   MCHC 31.9 07/03/2017 1007   RDW 14.6 (H) 07/03/2017 1007   LYMPHSABS 1.3 07/03/2017 1007   MONOABS 0.4 07/03/2017 1007  EOSABS 0.0 07/03/2017 1007   BASOSABS 0.0 07/03/2017 1007    CMP     Component Value Date/Time   NA 142 07/03/2017 1007   K 4.1 07/03/2017 1007   CO2 24 07/03/2017 1007   GLUCOSE 93 07/03/2017 1007   BUN 19.1 07/03/2017 1007   CREATININE 1.0 07/03/2017 1007   CALCIUM 9.7 07/03/2017 1007   PROT 7.1 07/03/2017 1007   ALBUMIN 3.4 (L) 07/03/2017 1007   AST 16 07/03/2017 1007   ALT 12 07/03/2017 1007   ALKPHOS 92 07/03/2017 1007   BILITOT 0.65 07/03/2017 1007     STUDIES:  Mm Diag Breast Tomo Bilateral  Result Date: 06/19/2017 CLINICAL DATA:  Left lumpectomy.  Annual mammography. EXAM: 2D DIGITAL DIAGNOSTIC BILATERAL MAMMOGRAM WITH CAD AND ADJUNCT TOMO COMPARISON:  Previous exam(s). ACR Breast Density Category b: There are scattered areas of fibroglandular density. FINDINGS: The  left lumpectomy site is stable. No suspicious masses, calcifications, or distortion Mammographic images were processed with CAD. IMPRESSION: No mammographic evidence of malignancy. RECOMMENDATION: Annual diagnostic mammography. I have discussed the findings and recommendations with the patient. Results were also provided in writing at the conclusion of the visit. If applicable, a reminder letter will be sent to the patient regarding the next appointment. BI-RADS CATEGORY  2: Benign. Electronically Signed   By: Dorise Bullion III M.D   On: 06/19/2017 13:34    ASSESSMENT: 66 y.o. [place] woman  (1) status post left upper outer quadrant lumpectomy and sentinel lymph node biopsy 05/20/2012 for a pT1c pN0, stage IA invasive ductal carcinoma, grade 1, estrogen receptor 100% positive, progesterone receptor 52% positive, with no HER-2 amplification and an MIB-1 of 8%  (2) Oncotype DX score of 0 predicted a distant disease risk of recurrence over 10 years of approximately 3% if the patient's only systemic treatment was tamoxifen for 5 years. It also predicted no benefit from chemotherapy.  (3) the patient completed adjuvant radiation 09/17/2012, receiving 36 treatments to the left breast using medial and lateral tangential fields to a dose of 5040 cGy, followed by a boost to the postoperative bed of an additional 1440 cGy (given dose 6648).  (4) anastrozole started December 2013, completing 5 years December 2018.  (5) osteopenia -- t-score -2.4 on 06/10/15.   (a) patient opted against Prolia  PLAN: Sharni is now just over 5 years out from definitive surgery for her breast cancer with no evidence of disease recurrence. That is very favorable.  She is completing 5 years of anastrozole in December. She has about 3 month's supply left. She will simply take what she has and then stop. She does not need to taper to off.  I am hopeful when she does go off the anastrozole some of the occasional aches and  pains she is feeling particularly in her knees may improve. However most likely she is dealing with osteoarthritis.  I reassured her that the discomfort in her surgical breast is postoperative and does not indicate recurrent breast cancer.  She is an excellent candidate for survivorship program and I have made her a return appointment here in September of next year.  At this point I feel comfortable releasing her to her primary care physician. As she will need in terms of breast cancer follow-up is yearly mammography and a yearly physician breast exam  I will be glad to see her at any point in the future if on when the need arises but as of now are making no further routine appointment for  her here   Henrene Pastor, MD   07/10/2017 9:09 AM

## 2017-07-10 NOTE — Telephone Encounter (Signed)
Gave patient avs and calendar with appts per 9/18 los

## 2017-09-22 ENCOUNTER — Other Ambulatory Visit: Payer: Self-pay | Admitting: Nurse Practitioner

## 2017-11-20 ENCOUNTER — Other Ambulatory Visit: Payer: Self-pay | Admitting: Gastroenterology

## 2017-11-20 DIAGNOSIS — K56699 Other intestinal obstruction unspecified as to partial versus complete obstruction: Secondary | ICD-10-CM

## 2017-12-04 ENCOUNTER — Ambulatory Visit
Admission: RE | Admit: 2017-12-04 | Discharge: 2017-12-04 | Disposition: A | Discharge status: Home / Self Care | Payer: 59 | Source: Ambulatory Visit | Attending: Gastroenterology | Admitting: Gastroenterology

## 2017-12-04 DIAGNOSIS — K56699 Other intestinal obstruction unspecified as to partial versus complete obstruction: Secondary | ICD-10-CM

## 2017-12-04 MED ORDER — IOPAMIDOL (ISOVUE-300) INJECTION 61%
100.0000 mL | Freq: Once | INTRAVENOUS | Status: AC | PRN
  Administered 2017-12-04: 125 mL via INTRAVENOUS

## 2018-04-02 ENCOUNTER — Other Ambulatory Visit: Payer: Self-pay | Admitting: Family Medicine

## 2018-04-02 DIAGNOSIS — Z1231 Encounter for screening mammogram for malignant neoplasm of breast: Secondary | ICD-10-CM

## 2018-04-18 ENCOUNTER — Other Ambulatory Visit (HOSPITAL_COMMUNITY)
Admission: RE | Admit: 2018-04-18 | Discharge: 2018-04-18 | Disposition: A | Discharge status: Home / Self Care | Payer: 59 | Source: Ambulatory Visit | Attending: Family Medicine | Admitting: Family Medicine

## 2018-04-18 ENCOUNTER — Other Ambulatory Visit: Payer: Self-pay | Admitting: Family Medicine

## 2018-04-18 DIAGNOSIS — Z01419 Encounter for gynecological examination (general) (routine) without abnormal findings: Secondary | ICD-10-CM | POA: Diagnosis not present

## 2018-04-19 LAB — CYTOLOGY - PAP: DIAGNOSIS: NEGATIVE

## 2018-05-31 ENCOUNTER — Other Ambulatory Visit: Payer: Self-pay | Admitting: Gastroenterology

## 2018-05-31 DIAGNOSIS — R933 Abnormal findings on diagnostic imaging of other parts of digestive tract: Secondary | ICD-10-CM

## 2018-06-15 ENCOUNTER — Ambulatory Visit
Admission: RE | Admit: 2018-06-15 | Discharge: 2018-06-15 | Disposition: A | Discharge status: Home / Self Care | Payer: 59 | Source: Ambulatory Visit | Attending: Gastroenterology | Admitting: Gastroenterology

## 2018-06-15 DIAGNOSIS — R933 Abnormal findings on diagnostic imaging of other parts of digestive tract: Secondary | ICD-10-CM

## 2018-06-15 MED ORDER — GADOBENATE DIMEGLUMINE 529 MG/ML IV SOLN
20.0000 mL | Freq: Once | INTRAVENOUS | Status: AC | PRN
  Administered 2018-06-15: 20 mL via INTRAVENOUS

## 2018-06-20 ENCOUNTER — Ambulatory Visit
Admission: RE | Admit: 2018-06-20 | Discharge: 2018-06-20 | Disposition: A | Discharge status: Home / Self Care | Payer: 59 | Source: Ambulatory Visit | Attending: Family Medicine | Admitting: Family Medicine

## 2018-06-20 DIAGNOSIS — Z1231 Encounter for screening mammogram for malignant neoplasm of breast: Secondary | ICD-10-CM

## 2018-07-05 ENCOUNTER — Telehealth: Payer: Self-pay

## 2018-07-05 NOTE — Telephone Encounter (Signed)
Spoke with patient to remind of S[facility] visit with [place] on 07/09/18 at 2 pm. Patient said she will come to visit.

## 2018-07-09 ENCOUNTER — Telehealth: Payer: Self-pay | Admitting: Adult Health

## 2018-07-09 ENCOUNTER — Encounter: Payer: Self-pay | Admitting: Adult Health

## 2018-07-09 ENCOUNTER — Inpatient Hospital Stay: Payer: 59 | Attending: Adult Health | Admitting: Adult Health

## 2018-07-09 VITALS — BP 129/69 | HR 66 | Temp 97.8°F | Resp 18 | Ht 61.0 in | Wt 203.5 lb

## 2018-07-09 DIAGNOSIS — Z8051 Family history of malignant neoplasm of kidney: Secondary | ICD-10-CM | POA: Diagnosis not present

## 2018-07-09 DIAGNOSIS — Z803 Family history of malignant neoplasm of breast: Secondary | ICD-10-CM | POA: Diagnosis not present

## 2018-07-09 DIAGNOSIS — Z170 Estrogen receptor positive status [ER+]: Secondary | ICD-10-CM

## 2018-07-09 DIAGNOSIS — Z87891 Personal history of nicotine dependence: Secondary | ICD-10-CM

## 2018-07-09 DIAGNOSIS — Z853 Personal history of malignant neoplasm of breast: Secondary | ICD-10-CM | POA: Diagnosis not present

## 2018-07-09 DIAGNOSIS — C50412 Malignant neoplasm of upper-outer quadrant of left female breast: Secondary | ICD-10-CM

## 2018-07-09 NOTE — Patient Instructions (Signed)
Bone Health Bones protect organs, store calcium, and anchor muscles. Good health habits, such as eating nutritious foods and exercising regularly, are important for maintaining healthy bones. They can also help to prevent a condition that causes bones to lose density and become weak and brittle (osteoporosis). Why is bone mass important? Bone mass refers to the amount of bone tissue that you have. The higher your bone mass, the stronger your bones. An important step toward having healthy bones throughout life is to have strong and dense bones during childhood. A young adult who has a high bone mass is more likely to have a high bone mass later in life. Bone mass at its greatest it is called peak bone mass. A large decline in bone mass occurs in older adults. In women, it occurs about the time of menopause. During this time, it is important to practice good health habits, because if more bone is lost than what is replaced, the bones will become less healthy and more likely to break (fracture). If you find that you have a low bone mass, you may be able to prevent osteoporosis or further bone loss by changing your diet and lifestyle. How can I find out if my bone mass is low? Bone mass can be measured with an X-ray test that is called a bone mineral density (BMD) test. This test is recommended for all women who are age 65 or older. It may also be recommended for men who are age 70 or older, or for people who are more likely to develop osteoporosis due to:  Having bones that break easily.  Having a long-term disease that weakens bones, such as kidney disease or rheumatoid arthritis.  Having menopause earlier than normal.  Taking medicine that weakens bones, such as steroids, thyroid hormones, or hormone treatment for breast cancer or prostate cancer.  Smoking.  Drinking three or more alcoholic drinks each day.  What are the nutritional recommendations for healthy bones? To have healthy bones, you  need to get enough of the right minerals and vitamins. Most nutrition experts recommend getting these nutrients from the foods that you eat. Nutritional recommendations vary from person to person. Ask your health care provider what is healthy for you. Here are some general guidelines. Calcium Recommendations Calcium is the most important (essential) mineral for bone health. Most people can get enough calcium from their diet, but supplements may be recommended for people who are at risk for osteoporosis. Good sources of calcium include:  Dairy products, such as low-fat or nonfat milk, cheese, and yogurt.  Dark green leafy vegetables, such as bok choy and broccoli.  Calcium-fortified foods, such as orange juice, cereal, bread, soy beverages, and tofu products.  Nuts, such as almonds.  Follow these recommended amounts for daily calcium intake:  Children, age 1?3: 700 mg.  Children, age 4?8: 1,000 mg.  Children, age 9?13: 1,300 mg.  Teens, age 14?18: 1,300 mg.  Adults, age 19?50: 1,000 mg.  Adults, age 51?70: ? Men: 1,000 mg. ? Women: 1,200 mg.  Adults, age 71 or older: 1,200 mg.  Pregnant and breastfeeding females: ? Teens: 1,300 mg. ? Adults: 1,000 mg.  Vitamin D Recommendations Vitamin D is the most essential vitamin for bone health. It helps the body to absorb calcium. Sunlight stimulates the skin to make vitamin D, so be sure to get enough sunlight. If you live in a cold climate or you do not get outside often, your health care provider may recommend that you take vitamin   D supplements. Good sources of vitamin D in your diet include:  Egg yolks.  Saltwater fish.  Milk and cereal fortified with vitamin D.  Follow these recommended amounts for daily vitamin D intake:  Children and teens, age 1?18: 600 international units.  Adults, age 50 or younger: 400-800 international units.  Adults, age 51 or older: 800-1,000 international units.  Other Nutrients Other nutrients  for bone health include:  Phosphorus. This mineral is found in meat, poultry, dairy foods, nuts, and legumes. The recommended daily intake for adult men and adult women is 700 mg.  Magnesium. This mineral is found in seeds, nuts, dark green vegetables, and legumes. The recommended daily intake for adult men is 400?420 mg. For adult women, it is 310?320 mg.  Vitamin K. This vitamin is found in green leafy vegetables. The recommended daily intake is 120 mg for adult men and 90 mg for adult women.  What type of physical activity is best for building and maintaining healthy bones? Weight-bearing and strength-building activities are important for building and maintaining peak bone mass. Weight-bearing activities cause muscles and bones to work against gravity. Strength-building activities increases muscle strength that supports bones. Weight-bearing and muscle-building activities include:  Walking and hiking.  Jogging and running.  Dancing.  Gym exercises.  Lifting weights.  Tennis and racquetball.  Climbing stairs.  Aerobics.  Adults should get at least 30 minutes of moderate physical activity on most days. Children should get at least 60 minutes of moderate physical activity on most days. Ask your health care provide what type of exercise is best for you. Where can I find more information? For more information, check out the following websites:  National Osteoporosis Foundation: http://nof.org/learn/basics  National Institutes of Health: http://www.niams.nih.gov/Health_Info/Bone/Bone_Health/bone_health_for_life.asp  This information is not intended to replace advice given to you by your health care provider. Make sure you discuss any questions you have with your health care provider. Document Released: 12/30/2003 Document Revised: 04/28/2016 Document Reviewed: 10/14/2014 Elsevier Interactive Patient Education  2018 [employer]  

## 2018-07-09 NOTE — Telephone Encounter (Signed)
Gave avs and calendar ° °

## 2018-07-09 NOTE — Progress Notes (Signed)
CLINIC:  Survivorship   REASON FOR VISIT:  Routine follow-up for history of breast cancer.   BRIEF ONCOLOGIC HISTORY:  Per Dr. Jana Hakim note from 07/10/17  (1) status post left upper outer quadrant lumpectomy and sentinel lymph node biopsy 05/20/2012 for a pT1c pN0, stage IA invasive ductal carcinoma, grade 1, estrogen receptor 100% positive, progesterone receptor 52% positive, with no HER-2 amplification and an MIB-1 of 8%  (2) Oncotype DX score of 0 predicted a distant disease risk of recurrence over 10 years of approximately 3% if the patient's only systemic treatment was tamoxifen for 5 years. It also predicted no benefit from chemotherapy.  (3) the patient completed adjuvant radiation 09/17/2012, receiving 36 treatments to the left breast using medial and lateral tangential fields to a dose of 5040 cGy, followed by a boost to the postoperative bed of an additional 1440 cGy (given dose 6648).  (4) anastrozole started December 2013, completing 5 years December 2018.  (5) osteopenia -- t-score -2.4 on 06/10/15.              (a) patient opted against Prolia  INTERVAL HISTORY:  Sally Morrison presents to the [facility] today for routine follow-up for her history of breast cancer.  Overall, she reports feeling quite well.  She is exercising regularly.  She is f/u with her PCP regularly. She is up to date with her cancer screenings.      REVIEW OF SYSTEMS:  Review of Systems  Constitutional: Negative for appetite change, chills, fatigue, fever and unexpected weight change.  HENT:   Negative for hearing loss, sore throat and trouble swallowing.   Eyes: Negative for eye problems and icterus.  Respiratory: Negative for chest tightness, cough and shortness of breath.   Cardiovascular: Negative for chest pain, leg swelling and palpitations.  Gastrointestinal: Negative for abdominal distention, abdominal pain, constipation, diarrhea, nausea and vomiting.  Endocrine: Negative for  hot flashes.  Musculoskeletal: Negative for arthralgias.  Skin: Negative for itching and rash.  Neurological: Negative for dizziness, extremity weakness, headaches and numbness.  Hematological: Negative for adenopathy. Does not bruise/bleed easily.  Psychiatric/Behavioral: Negative for depression. The patient is not nervous/anxious.    Breast: Denies any new nodularity, masses, tenderness, nipple changes, or nipple discharge.       PAST MEDICAL/SURGICAL HISTORY:  Past Medical History:  Diagnosis Date  . Allergy   . Anemia   . Breast cancer ([place])   . Cancer ([place])   . Hypertension   . Personal history of radiation therapy 2013  . Sickle cell trait [facility])    Past Surgical History:  Procedure Laterality Date  . ABDOMINAL HYSTERECTOMY    . BREAST BIOPSY Left 04/19/2012   malignant  . BREAST LUMPECTOMY Left 05/20/2012  . BREAST SURGERY     left breast lumpectomy  . CHOLECYSTECTOMY    . COLONOSCOPY WITH PROPOFOL N/A 05/21/2014   Procedure: COLONOSCOPY WITH PROPOFOL;  Surgeon: Juanita Craver, MD;  Location: [facility];  Service: Endoscopy;  Laterality: N/A;     ALLERGIES:  Allergies  Allergen Reactions  . Penicillins Rash  . Aleve [Naproxen Sodium] Itching     CURRENT MEDICATIONS:  Outpatient Encounter Medications as of 07/09/2018  Medication Sig Note  . losartan-hydrochlorothiazide (HYZAAR) 50-12.5 MG per tablet Take 0.5 tablets by mouth every morning.  05/21/2014: Pt took half dose  . [DISCONTINUED] anastrozole (ARIMIDEX) 1 MG tablet Take 1 tablet (1 mg total) by mouth at bedtime. (Patient not taking: Reported on 07/09/2018)   . [DISCONTINUED] gabapentin (  NEURONTIN) 300 MG capsule Take 1 capsule (300 mg total) by mouth at bedtime. (Patient not taking: Reported on 07/09/2018)    No facility-administered encounter medications on file as of 07/09/2018.      ONCOLOGIC FAMILY HISTORY:  Family History  Problem Relation Age of Onset  . Bladder Cancer Sister   . Breast cancer  Maternal Aunt   . Breast cancer Cousin   . Kidney cancer Cousin     GENETIC COUNSELING/TESTING: discussed  SOCIAL HISTORY:  Social History   Socioeconomic History  . Marital status: Unknown    Spouse name: Not on file  . Number of children: Not on file  . Years of education: Not on file  . Highest education level: Not on file  Occupational History  . Not on file  Social Needs  . Financial resource strain: Not on file  . Food insecurity:    Worry: Not on file    Inability: Not on file  . Transportation needs:    Medical: Not on file    Non-medical: Not on file  Tobacco Use  . Smoking status: Former Smoker    Packs/day: 0.25    Years: 4.00    Pack years: 1.00    Types: Cigarettes  . Smokeless tobacco: Never Used  . Tobacco comment: smoked in College for 2-3 years  Substance and Sexual Activity  . Alcohol use: No  . Drug use: No  . Sexual activity: Yes    Birth control/protection: Post-menopausal  Lifestyle  . Physical activity:    Days per week: Not on file    Minutes per session: Not on file  . Stress: Not on file  Relationships  . Social connections:    Talks on phone: Not on file    Gets together: Not on file    Attends religious service: Not on file    Active member of club or organization: Not on file    Attends meetings of clubs or organizations: Not on file    Relationship status: Not on file  . Intimate partner violence:    Fear of current or ex partner: Not on file    Emotionally abused: Not on file    Physically abused: Not on file    Forced sexual activity: Not on file  Other Topics Concern  . Not on file  Social History Narrative  . Not on file     PHYSICAL EXAMINATION:  Vital Signs: Vitals:   07/09/18 1342  BP: 129/69  Pulse: 66  Resp: 18  Temp: 97.8 F (36.6 C)  SpO2: 100%   Filed Weights   07/09/18 1342  Weight: 203 lb 8 oz (92.3 kg)   General: Well-nourished, well-appearing female in no acute distress.  Unaccompanied today.    HEENT: Head is normocephalic.  Pupils equal and reactive to light. Conjunctivae clear without exudate.  Sclerae anicteric. Oral mucosa is pink, moist.  Oropharynx is pink without lesions or erythema.  Lymph: No cervical, supraclavicular, or infraclavicular lymphadenopathy noted on palpation.  Cardiovascular: Regular rate and rhythm.Marland Kitchen Respiratory: Clear to auscultation bilaterally. Chest expansion symmetric; breathing non-labored.  Breast Exam:  -Left breast: No appreciable masses on palpation. No skin redness, thickening, or peau d'orange appearance; no nipple retraction or nipple discharge; mild distortion in symmetry at previous lumpectomy site well healed scar without erythema or nodularity.  -Right breast: No appreciable masses on palpation. No skin redness, thickening, or peau d'orange appearance; no nipple retraction or nipple discharge; -Axilla: No axillary adenopathy bilaterally.  GI:  Abdomen soft and round; non-tender, non-distended. Bowel sounds normoactive. No hepatosplenomegaly.   GU: Deferred.  Neuro: No focal deficits. Steady gait.  Psych: Mood and affect normal and appropriate for situation.  MSK: No focal spinal tenderness to palpation, full range of motion in bilateral upper extremities Extremities: No edema. Skin: Warm and dry.  LABORATORY DATA:  None for this visit   DIAGNOSTIC IMAGING:  Most recent mammogram:      ASSESSMENT AND PLAN:  Sally Morrison is a pleasant 67 y.o. female with history of Stage IA left breast invasive ductal carcinoma, ER+/PR+/HER2-, diagnosed in 04/2012, treated with lumpectomy, adjuvant radiation therapy, and anti-estrogen therapy with Anastrozole x 5 years completing therapy in 09/2017.  She presents to the Survivorship [facility] for surveillance and routine follow-up.   1. History of breast cancer:  Sally Morrison is currently clinically and radiographically without evidence of disease or recurrence of breast cancer. She will be due for mammogram  in 05/2019.  She will return in one year for conitnued long term surveillance and monitoring.   I encouraged her to call me with any questions or concerns before her next visit at the cancer center, and I would be happy to see her sooner, if needed.    2. Family history of breast cancer and sister with kidney cancer: Will send this updated info to our genetic counselors.  She likely will qualify for genetic testing.    3. Bone health:  Given Sally Morrison's age, history of breast cancer, and her previous anti-estrogen therapy with Anastrozole, she is at risk for bone demineralization. Her last DEXA scan was on 05/31/2015 and demonstrated a T score of -2.4 in the spine.  I offered to retest her bone density today.  She is going to review with her PCP and let me know what she decides.  She was given education on specific food and activities to promote bone health.  4. Cancer screening:  Due to Sally Morrison's history and her age, she should receive screening for skin cancers, colon cancer. She was encouraged to follow-up with her PCP for appropriate cancer screenings.   5. Health maintenance and wellness promotion: Sally Morrison was encouraged to consume 5-7 servings of fruits and vegetables per day. She was also encouraged to engage in moderate to vigorous exercise for 30 minutes per day most days of the week. She was instructed to limit her alcohol consumption and continue to abstain from tobacco use.   Dispo:  -Return to cancer center in one year for LTS follow up -Mammogram in 05/2019 -potential genetics referral   A total of (30) minutes of face-to-face time was spent with this patient with greater than 50% of that time in counseling and care-coordination.   Gardenia Phlegm, NP Survivorship Program [facility] [phone]   Note: PRIMARY CARE PROVIDER Lucianne Lei, [place] [phone]

## 2018-08-06 ENCOUNTER — Inpatient Hospital Stay: Payer: 59 | Attending: Adult Health | Admitting: Genetics

## 2018-08-06 ENCOUNTER — Inpatient Hospital Stay: Payer: 59

## 2018-08-06 DIAGNOSIS — Z853 Personal history of malignant neoplasm of breast: Secondary | ICD-10-CM

## 2018-08-06 DIAGNOSIS — C50412 Malignant neoplasm of upper-outer quadrant of left female breast: Secondary | ICD-10-CM

## 2018-08-06 DIAGNOSIS — Z803 Family history of malignant neoplasm of breast: Secondary | ICD-10-CM

## 2018-08-06 DIAGNOSIS — Z8051 Family history of malignant neoplasm of kidney: Secondary | ICD-10-CM | POA: Diagnosis not present

## 2018-08-06 DIAGNOSIS — Z170 Estrogen receptor positive status [ER+]: Principal | ICD-10-CM

## 2018-08-06 DIAGNOSIS — Z8052 Family history of malignant neoplasm of bladder: Secondary | ICD-10-CM | POA: Diagnosis not present

## 2018-08-06 DIAGNOSIS — Z315 Encounter for genetic counseling: Secondary | ICD-10-CM

## 2018-08-07 ENCOUNTER — Encounter: Payer: Self-pay | Admitting: Genetics

## 2018-08-07 DIAGNOSIS — Z8051 Family history of malignant neoplasm of kidney: Secondary | ICD-10-CM | POA: Insufficient documentation

## 2018-08-07 DIAGNOSIS — Z8052 Family history of malignant neoplasm of bladder: Secondary | ICD-10-CM | POA: Insufficient documentation

## 2018-08-07 DIAGNOSIS — Z803 Family history of malignant neoplasm of breast: Secondary | ICD-10-CM | POA: Insufficient documentation

## 2018-08-07 NOTE — Progress Notes (Signed)
REFERRING PROVIDER: Gardenia Phlegm, NP [place], [place] [address]  PRIMARY PROVIDER:  Lucianne Lei, MD  PRIMARY REASON FOR VISIT:  1. Malignant neoplasm of upper-outer quadrant of left breast in female, estrogen receptor positive ([place])   2. Family history of breast cancer   3. Family history of bladder cancer   4. Family history of kidney cancer     HISTORY OF PRESENT ILLNESS:   Ms. Felty, a 67 y.o. female, was seen for a [place] cancer genetics consultation at the request of Dr. Delice Bison due to a personal and family history of cancer.  Ms. Maciolek presents to clinic today to discuss the possibility of a hereditary predisposition to cancer, genetic testing, and to further clarify her future cancer risks, as well as potential cancer risks for family members.   In 2013, at the age of 13, Ms. Pacer was diagnosed with invasive ductal carcinoma of the left breast. This was treated with left lumpectomy+ adjuvant radiation and antiestrogen therapy.  (ER/PR+)   HORMONAL RISK FACTORS:  Menarche was at age [age].  First live birth at age 61.  Ovaries intact: no.  Hysterectomy: yes.  Menopausal status: postmenopausal.  Colonoscopy: yes; a few hyperplastic polyps. Mammogram within the last year: yes.  Past Medical History:  Diagnosis Date  . Allergy   . Anemia   . Breast cancer ([place])   . Cancer ([place])   . Family history of bladder cancer   . Family history of breast cancer   . Family history of kidney cancer   . Hypertension   . Personal history of radiation therapy 2013  . Sickle cell trait [facility])     Past Surgical History:  Procedure Laterality Date  . ABDOMINAL HYSTERECTOMY    . BREAST BIOPSY Left 04/19/2012   malignant  . BREAST LUMPECTOMY Left 05/20/2012  . BREAST SURGERY     left breast lumpectomy  . CHOLECYSTECTOMY    . COLONOSCOPY WITH PROPOFOL N/A 05/21/2014   Procedure: COLONOSCOPY WITH PROPOFOL;  Surgeon: Juanita Craver, MD;   Location[facility];  Service: Endoscopy;  Laterality: N/A;    Social History   Socioeconomic History  . Marital status: Unknown    Spouse name: Not on file  . Number of children: Not on file  . Years of education: Not on file  . Highest education level: Not on file  Occupational History  . Not on file  Social Needs  . Financial resource strain: Not on file  . Food insecurity:    Worry: Not on file    Inability: Not on file  . Transportation needs:    Medical: Not on file    Non-medical: Not on file  Tobacco Use  . Smoking status: Former Smoker    Packs/day: 0.25    Years: 4.00    Pack years: 1.00    Types: Cigarettes  . Smokeless tobacco: Never Used  . Tobacco comment: smoked in College for 2-3 years  Substance and Sexual Activity  . Alcohol use: No  . Drug use: No  . Sexual activity: Yes    Birth control/protection: Post-menopausal  Lifestyle  . Physical activity:    Days per week: Not on file    Minutes per session: Not on file  . Stress: Not on file  Relationships  . Social connections:    Talks on phone: Not on file    Gets together: Not on file    Attends religious service: Not on file  Active member of club or organization: Not on file    Attends meetings of clubs or organizations: Not on file    Relationship status: Not on file  Other Topics Concern  . Not on file  Social History Narrative  . Not on file     FAMILY HISTORY:  We obtained a detailed, 4-generation family history.  Significant diagnoses are listed below: Family History  Problem Relation Age of Onset  . Bladder Cancer Sister 38  . Breast cancer Maternal Aunt        age dx unk  . Breast cancer Cousin   . Kidney cancer Cousin        dx 64's  . Breast cancer Other   . Breast cancer Cousin        dx 20's/30's    Ms. Boyett has a 26 year-old son and a 6 year-old daughter with no hx of cancer.  She has 6 grandchildren.   Ms. Sturgill has a full sister who was dx with bladder  cancer at 62 and a brother with no hx of cancer. Ms. Ferran also has 7  paternal half siblings, but she has little contact with them and does not know their health status.   Ms. Dimario father: died at 44 due to dementia.   Paternal Aunts/Uncles: 5 paternal aunts/uncles, no known hx of cancer, limited info Paternal cousins: no known hx of cancer Paternal grandfather: unk Paternal grandmother:thinks she had dementia?  Ms. Kovacevic mother: died at 92 due to heart disease.  She had her uterus and ovaries intact.  Maternal Aunts/Uncles: 1 full maternal aunt and 2 full maternal uncles. The full aunt had a daughter (patient's cousin) who had kidney cancer in her 63's.   Patient has several maternal half-aunts/ucnles.  1 of these aunts had breast cancer.  1 of these maternal half-aunts had a daughter, Clarise Cruz, who had breast cancer in her 48's.  Sara's granddaughter had breast cancer in her 54's or 32's.   Maternal grandfather: died in middle age, no hx of cancer Maternal grandmother:died at 67 with no hx of cancer.   Ms. Darius reports that one of her mother's half sisters married her father's brother.   Ms. Lightcap is unaware of previous family history of genetic testing for hereditary cancer risks. Patient's maternal ancestors are of African American descent, and paternal ancestors are of African American descent. There is no reported Ashkenazi Jewish ancestry. There is no known consanguinity.  GENETIC COUNSELING ASSESSMENT: Edward Trevino is a 67 y.o. female with a personal and family history which is somewhat suggestive of a Hereditary Cancer Predisposition Syndrome. We, therefore, discussed and recommended the following at today's visit.   DISCUSSION: We reviewed the characteristics, features and inheritance patterns of hereditary cancer syndromes. We also discussed genetic testing, including the appropriate family members to test, the process of testing, insurance coverage and  turn-around-time for results. We discussed the implications of a negative, positive and/or variant of uncertain significant result.   We discussed that only 5-10% of cancers are associated with a Hereditary cancer predisposition syndrome.  One of the most common hereditary cancer syndromes that increases breast cancer risk is called Hereditary Breast and Ovarian Cancer (HBOC) syndrome.  This syndrome is caused by mutations in the BRCA1 and BRCA2 genes.  This syndrome increases an individual's lifetime risk to develop breast, ovarian, pancreatic, and other types of cancer.  There are also many other cancer predisposition syndromes caused by mutations in several other genes.  We discussed  that if she is found to have a mutation in one of these genes, it may impact future medical management recommendations such as increased cancer screenings and consideration of risk reducing surgeries.  A positive result could also have implications for the patient's family members.  A Negative result would mean we were unable to identify a hereditary component to her cancer, but does not rule out the possibility of a hereditary basis for her cancer.  There could be mutations that are undetectable by current technology, or in genes not yet tested or identified to increase cancer risk.    We discussed the potential to find a Variant of Uncertain Significance or VUS.  These are variants that have not yet been identified as pathogenic or benign, and it is unknown if this variant is associated with increased cancer risk or if this is a normal finding.  Most VUS's are reclassified to benign or likely benign.   It should not be used to make medical management decisions. With time, we suspect the lab will determine the significance of any VUS's identified if any.   Based on Ms. Solis's personal and family she does not quite meet NCCN medical criteria for genetic testing.  Her maternal 'aunt' and and 'cousins' with breast cancer  are actually a 'half aunt' and more distant cousins herefore, these cousins are beyond 3 degrees of relatedness to her.   However, we still offered Ms. Reha the option to have genetic testing.  We can ask the lab to do a benefit investigation to determine coverage up front and can contact her with the cost information.  She is aware the self pay price if insurance does not cover it is $250.     PLAN:  Ms. Babineau requested we do a benefit investigation and after getting this information will decide on weather to have testing or not.  Based on Ms. Buchinger's family history, we recommended her maternal relatives (maternal half aunt, distant cousins Clarise Cruz and Butch Penny) are recommended to have genetic counseling and testing. Ms. Thomure will let us know if we can be of any assistance in coordinating genetic counseling and/or testing for this family member.   Lastly, we encouraged Ms. Eggleton to remain in contact with cancer genetics annually so that we can continuously update the family history and inform her of any changes in cancer genetics and testing that may be of benefit for this family.   Ms.  Ticer questions were answered to her satisfaction today. Our contact information was provided should additional questions or concerns arise. Thank you for the referral and allowing [place] to share in the care of your patient.   Tana Felts, MS, [facility] Certified Genetic Counselor [email or web address] phone: [phone]  The patient was seen for a total of 40 minutes in face-to-face genetic counseling.  This patient was discussed with Drs. Magrinat, Lindi Adie and/or Burr Medico who agrees with the above.

## 2018-08-21 ENCOUNTER — Telehealth: Payer: Self-pay | Admitting: Genetics

## 2018-08-21 NOTE — Telephone Encounter (Signed)
I informed the patient we received her benefit investigation back from [employer] about her genteic tesitng- they indicated her OOP cost was going to be $0.  I securely forwarded this message to her email.  After getting this information, Sally Morrison decided to proceed with genetic testing for the Common Hereditary Cancer Panel + Kidney cancer genes.   We scheduled her labs for 11/5 at 12:45 PM.

## 2018-08-27 ENCOUNTER — Inpatient Hospital Stay: Payer: 59 | Attending: Adult Health

## 2018-09-06 ENCOUNTER — Telehealth: Payer: Self-pay | Admitting: Genetics

## 2018-10-14 DIAGNOSIS — R05 Cough: Secondary | ICD-10-CM | POA: Diagnosis not present

## 2018-10-14 DIAGNOSIS — J019 Acute sinusitis, unspecified: Secondary | ICD-10-CM | POA: Diagnosis not present

## 2018-11-01 ENCOUNTER — Telehealth: Payer: Self-pay | Admitting: Genetics

## 2018-11-01 NOTE — Telephone Encounter (Signed)
Left message asking pt to call back to discuss genetic test results.  

## 2018-11-01 NOTE — Telephone Encounter (Signed)
Left message asking pt to call back to go over genetic test results.

## 2018-12-03 DIAGNOSIS — H524 Presbyopia: Secondary | ICD-10-CM | POA: Diagnosis not present

## 2018-12-06 ENCOUNTER — Telehealth: Payer: Self-pay | Admitting: Genetics

## 2018-12-06 NOTE — Telephone Encounter (Signed)
Left message asking patient to call back to go over genetic test results.   This is our 4th and last attempt to inform Sally Morrison about her genetic test results.  If she does not return this call, we will send a letter to her address on file requesting she contact us to learn her results.

## 2018-12-12 DIAGNOSIS — E1100 Type 2 diabetes mellitus with hyperosmolarity without nonketotic hyperglycemic-hyperosmolar coma (NKHHC): Secondary | ICD-10-CM | POA: Diagnosis not present

## 2018-12-12 DIAGNOSIS — I10 Essential (primary) hypertension: Secondary | ICD-10-CM | POA: Diagnosis not present

## 2018-12-12 DIAGNOSIS — D508 Other iron deficiency anemias: Secondary | ICD-10-CM | POA: Diagnosis not present

## 2018-12-12 DIAGNOSIS — R7301 Impaired fasting glucose: Secondary | ICD-10-CM | POA: Diagnosis not present

## 2018-12-12 DIAGNOSIS — N182 Chronic kidney disease, stage 2 (mild): Secondary | ICD-10-CM | POA: Diagnosis not present

## 2018-12-13 ENCOUNTER — Encounter: Payer: Self-pay | Admitting: Genetics

## 2018-12-17 DIAGNOSIS — I10 Essential (primary) hypertension: Secondary | ICD-10-CM | POA: Diagnosis not present

## 2018-12-17 DIAGNOSIS — E782 Mixed hyperlipidemia: Secondary | ICD-10-CM | POA: Diagnosis not present

## 2018-12-17 DIAGNOSIS — E1100 Type 2 diabetes mellitus with hyperosmolarity without nonketotic hyperglycemic-hyperosmolar coma (NKHHC): Secondary | ICD-10-CM | POA: Diagnosis not present

## 2018-12-17 DIAGNOSIS — N182 Chronic kidney disease, stage 2 (mild): Secondary | ICD-10-CM | POA: Diagnosis not present

## 2019-04-14 DIAGNOSIS — Z6839 Body mass index (BMI) 39.0-39.9, adult: Secondary | ICD-10-CM | POA: Diagnosis not present

## 2019-04-14 DIAGNOSIS — D649 Anemia, unspecified: Secondary | ICD-10-CM | POA: Diagnosis not present

## 2019-04-14 DIAGNOSIS — N182 Chronic kidney disease, stage 2 (mild): Secondary | ICD-10-CM | POA: Diagnosis not present

## 2019-04-14 DIAGNOSIS — E1100 Type 2 diabetes mellitus with hyperosmolarity without nonketotic hyperglycemic-hyperosmolar coma (NKHHC): Secondary | ICD-10-CM | POA: Diagnosis not present

## 2019-04-14 DIAGNOSIS — E441 Mild protein-calorie malnutrition: Secondary | ICD-10-CM | POA: Diagnosis not present

## 2019-04-14 DIAGNOSIS — E782 Mixed hyperlipidemia: Secondary | ICD-10-CM | POA: Diagnosis not present

## 2019-04-14 DIAGNOSIS — R1030 Lower abdominal pain, unspecified: Secondary | ICD-10-CM | POA: Diagnosis not present

## 2019-04-17 DIAGNOSIS — I10 Essential (primary) hypertension: Secondary | ICD-10-CM | POA: Diagnosis not present

## 2019-04-17 DIAGNOSIS — E1169 Type 2 diabetes mellitus with other specified complication: Secondary | ICD-10-CM | POA: Diagnosis not present

## 2019-05-20 ENCOUNTER — Other Ambulatory Visit: Payer: Self-pay | Admitting: Family Medicine

## 2019-05-20 DIAGNOSIS — Z1231 Encounter for screening mammogram for malignant neoplasm of breast: Secondary | ICD-10-CM

## 2019-06-18 IMAGING — MG DIGITAL SCREENING BILATERAL MAMMOGRAM WITH TOMO AND CAD
5 series · 6 of 13 positions shown · non-contrast
Comparison: Previous exam(s).

CLINICAL DATA: Screening.

EXAM:
DIGITAL SCREENING BILATERAL MAMMOGRAM WITH TOMO AND CAD

[L MLO synth-2D]
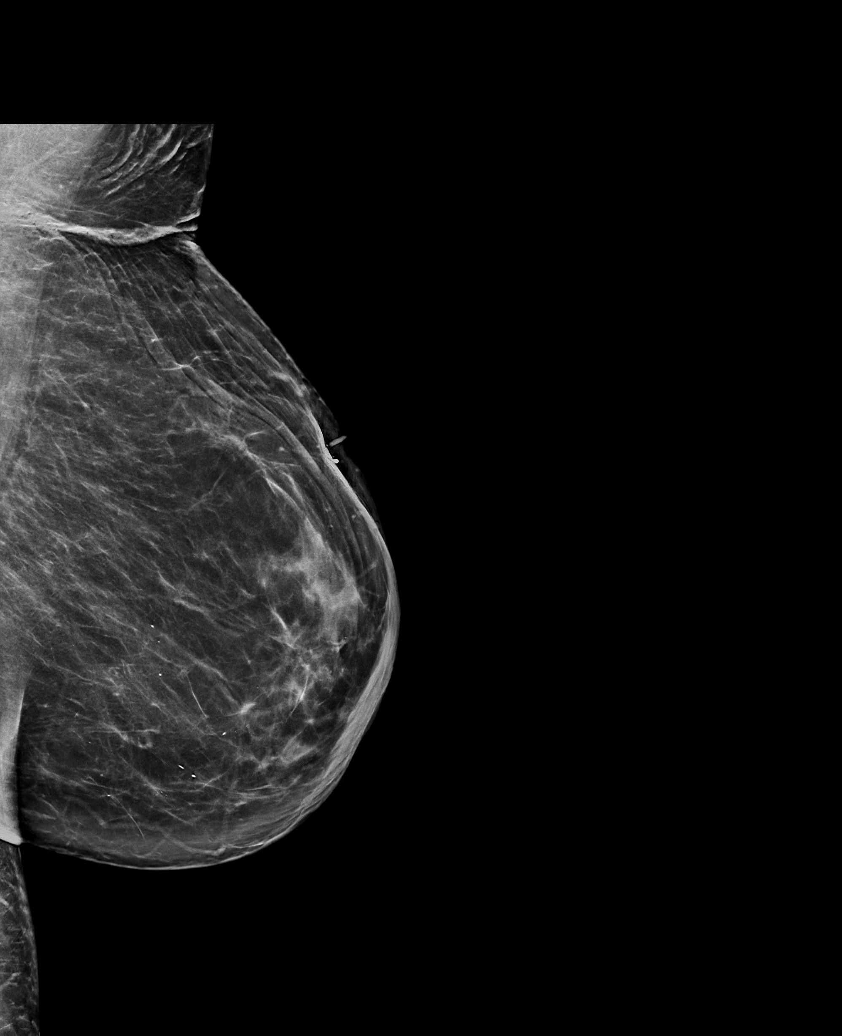

[L CC synth-2D]
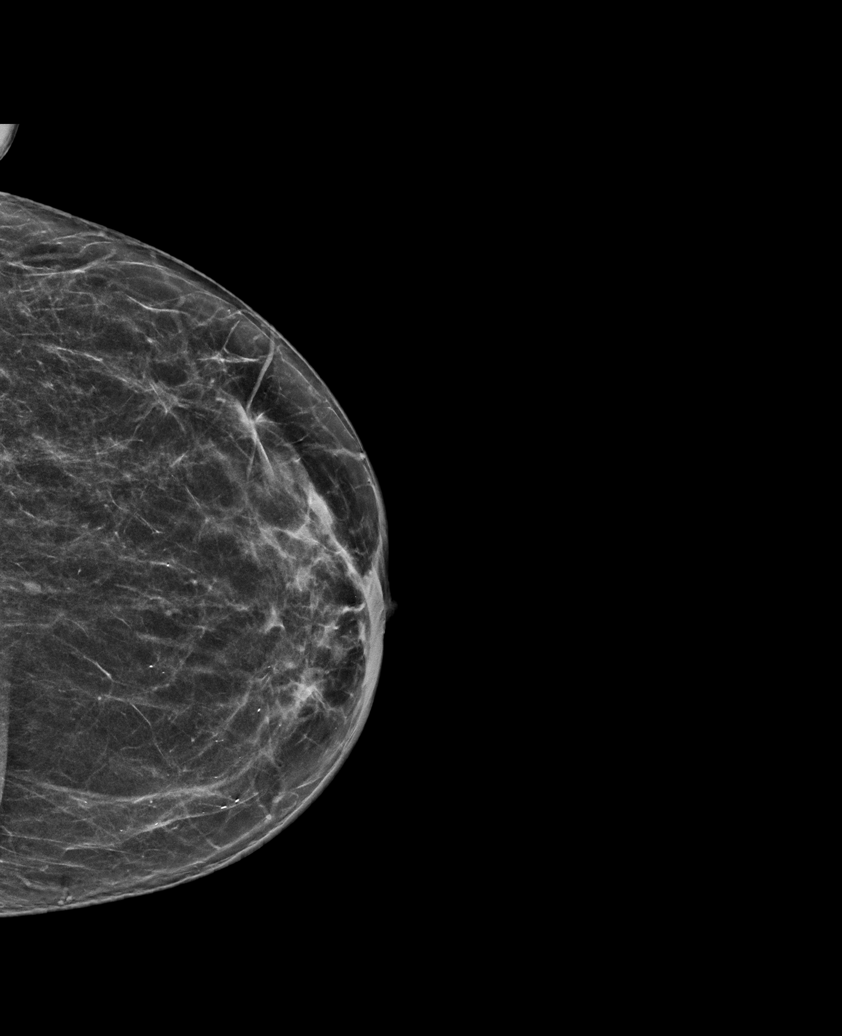

[R CC synth-2D]
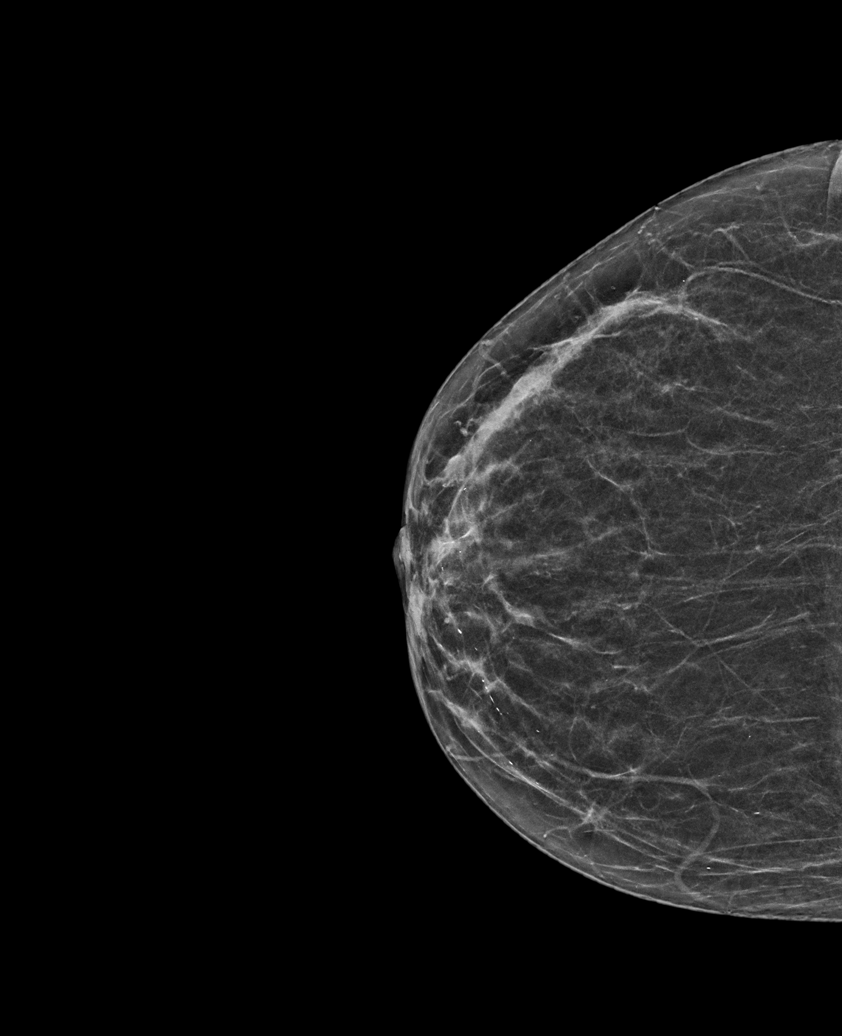

[L CC tomo · 2 of 60 frames shown]
[frame 20/60]
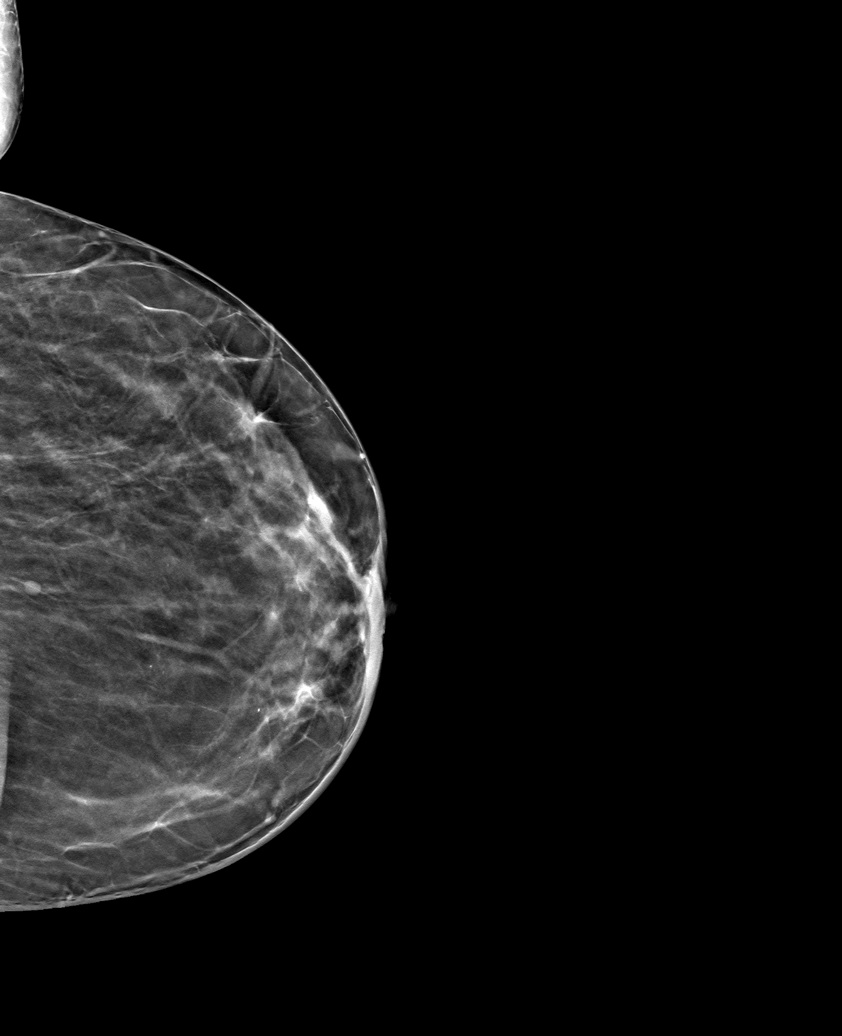
[frame 31/60]
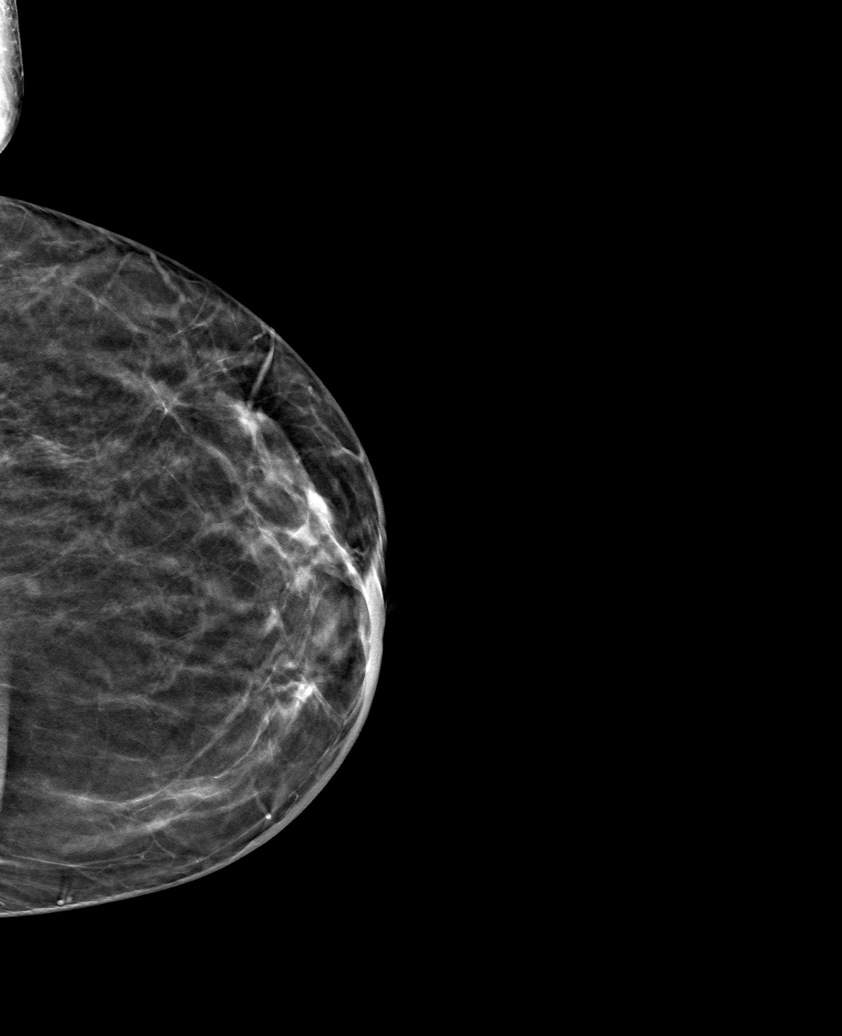

[R CC tomo · tomo slice 27/52.0]
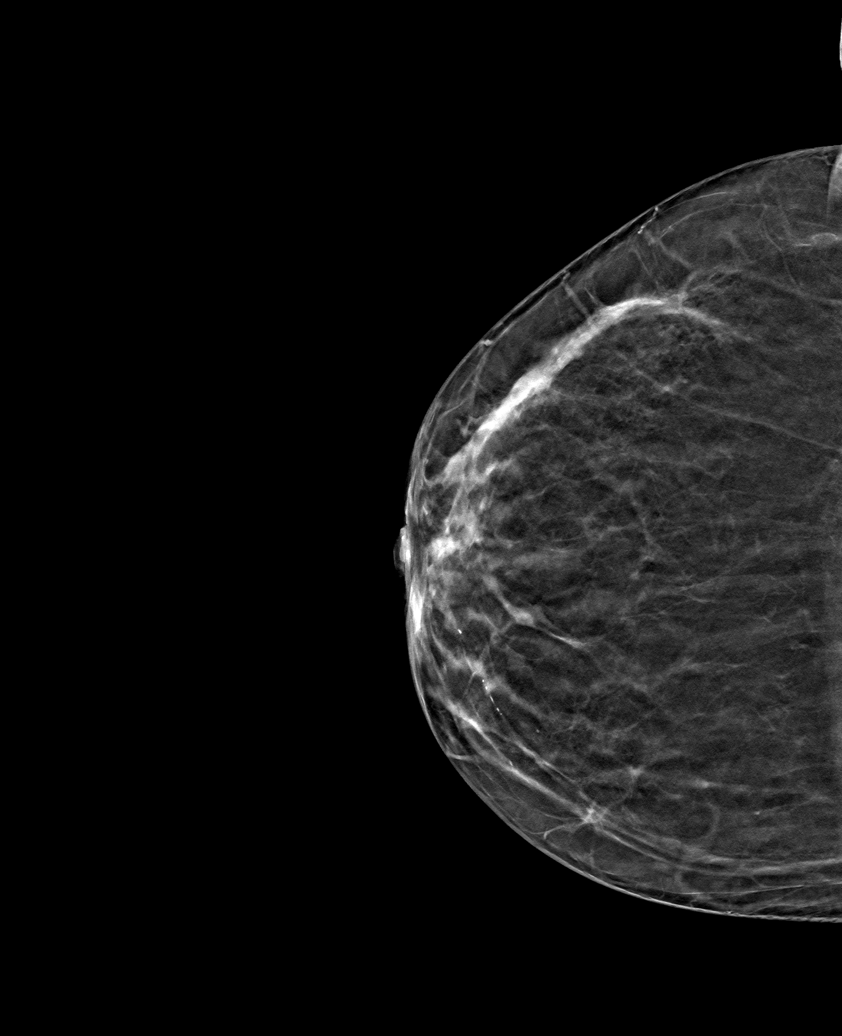

[6 of 13 positions shown; findings below may reference images not displayed]

ACR Breast Density Category b: There are scattered areas of
fibroglandular density.
FINDINGS: There are no findings suspicious for malignancy. Images were
processed with CAD.
IMPRESSION: No mammographic evidence of malignancy. A result letter of this
screening mammogram will be mailed directly to the patient.

RECOMMENDATION:
Screening mammogram in one year. (Code:[id number])

BI-RADS CATEGORY  1: Negative.

## 2019-07-04 ENCOUNTER — Other Ambulatory Visit: Payer: Self-pay

## 2019-07-04 ENCOUNTER — Ambulatory Visit
Admission: RE | Admit: 2019-07-04 | Discharge: 2019-07-04 | Disposition: A | Discharge status: Home / Self Care | Payer: Self-pay | Source: Ambulatory Visit | Attending: Family Medicine | Admitting: Family Medicine

## 2019-07-04 DIAGNOSIS — Z1231 Encounter for screening mammogram for malignant neoplasm of breast: Secondary | ICD-10-CM | POA: Diagnosis not present

## 2019-07-14 ENCOUNTER — Telehealth: Payer: Self-pay | Admitting: Oncology

## 2019-07-14 NOTE — Telephone Encounter (Signed)
[place] PAL 9/22 MOVED F/U FROM 9/22 TO 9/30. CONFIRMED WITH PATIENT.

## 2019-07-15 ENCOUNTER — Inpatient Hospital Stay: Payer: Medicare HMO | Admitting: Adult Health

## 2019-07-23 ENCOUNTER — Other Ambulatory Visit: Payer: Self-pay

## 2019-07-23 ENCOUNTER — Encounter: Payer: Self-pay | Admitting: Adult Health

## 2019-07-23 ENCOUNTER — Inpatient Hospital Stay: Payer: Medicare HMO | Attending: Adult Health | Admitting: Adult Health

## 2019-07-23 VITALS — BP 112/70 | HR 70 | Temp 97.8°F | Resp 18 | Ht 61.0 in | Wt 211.2 lb

## 2019-07-23 DIAGNOSIS — M8588 Other specified disorders of bone density and structure, other site: Secondary | ICD-10-CM | POA: Diagnosis not present

## 2019-07-23 DIAGNOSIS — C50412 Malignant neoplasm of upper-outer quadrant of left female breast: Secondary | ICD-10-CM

## 2019-07-23 DIAGNOSIS — E2839 Other primary ovarian failure: Secondary | ICD-10-CM

## 2019-07-23 DIAGNOSIS — M8580 Other specified disorders of bone density and structure, unspecified site: Secondary | ICD-10-CM | POA: Diagnosis not present

## 2019-07-23 DIAGNOSIS — Z170 Estrogen receptor positive status [ER+]: Secondary | ICD-10-CM

## 2019-07-23 DIAGNOSIS — Z853 Personal history of malignant neoplasm of breast: Secondary | ICD-10-CM | POA: Insufficient documentation

## 2019-07-23 NOTE — Progress Notes (Signed)
CLINIC:  Survivorship   REASON FOR VISIT:  Routine follow-up for history of breast cancer.   BRIEF ONCOLOGIC HISTORY:  Per Dr. Jana Hakim note from 07/10/17  (1) status post left upper outer quadrant lumpectomy and sentinel lymph node biopsy 05/20/2012 for a pT1c pN0, stage IA invasive ductal carcinoma, grade 1, estrogen receptor 100% positive, progesterone receptor 52% positive, with no HER-2 amplification and an MIB-1 of 8%  (2) Oncotype DX score of 0 predicted a distant disease risk of recurrence over 10 years of approximately 3% if the patient's only systemic treatment was tamoxifen for 5 years. It also predicted no benefit from chemotherapy.  (3) the patient completed adjuvant radiation 09/17/2012, receiving 36 treatments to the left breast using medial and lateral tangential fields to a dose of 5040 cGy, followed by a boost to the postoperative bed of an additional 1440 cGy (given dose 6648).  (4) anastrozole started December 2013, completing 5 years December 2018.  (5) osteopenia -- t-score -2.4 on 06/10/15.              (a) patient opted against Prolia  INTERVAL HISTORY:  Sally Morrison presents to the [facility] today for routine follow-up for her history of breast cancer.  Overall, she reports feeling quite well.  She is exercising regularly.  She is f/u with her PCP regularly. She is up to date with her cancer screenings.  She is due for bone density testing.    Since her last visit she underwent bilateral screening mammogram on 07/04/2019  that showed no evidence of malignancy and breast density category B.  REVIEW OF SYSTEMS:  Review of Systems  Constitutional: Negative for appetite change, chills, fatigue, fever and unexpected weight change.  HENT:   Negative for hearing loss, sore throat and trouble swallowing.   Eyes: Negative for eye problems and icterus.  Respiratory: Negative for chest tightness, cough and shortness of breath.   Cardiovascular: Negative for  chest pain, leg swelling and palpitations.  Gastrointestinal: Negative for abdominal distention, abdominal pain, constipation, diarrhea, nausea and vomiting.  Endocrine: Negative for hot flashes.  Genitourinary: Negative for difficulty urinating.   Musculoskeletal: Negative for arthralgias and back pain.  Skin: Negative for itching and rash.  Neurological: Negative for dizziness, extremity weakness, headaches and numbness.  Hematological: Negative for adenopathy. Does not bruise/bleed easily.  Psychiatric/Behavioral: Negative for depression. The patient is not nervous/anxious.   Breast: Denies any new nodularity, masses, tenderness, nipple changes, or nipple discharge.       PAST MEDICAL/SURGICAL HISTORY:  Past Medical History:  Diagnosis Date  . Allergy   . Anemia   . Breast cancer ([place])   . Cancer ([place])   . Family history of bladder cancer   . Family history of breast cancer   . Family history of kidney cancer   . Hypertension   . Personal history of radiation therapy 2013  . Sickle cell trait [facility])    Past Surgical History:  Procedure Laterality Date  . ABDOMINAL HYSTERECTOMY    . BREAST BIOPSY Left 04/19/2012   malignant  . BREAST LUMPECTOMY Left 05/20/2012  . BREAST SURGERY     left breast lumpectomy  . CHOLECYSTECTOMY    . COLONOSCOPY WITH PROPOFOL N/A 05/21/2014   Procedure: COLONOSCOPY WITH PROPOFOL;  Surgeon: Juanita Craver, MD;  Location: [facility];  Service: Endoscopy;  Laterality: N/A;     ALLERGIES:  Allergies  Allergen Reactions  . Penicillins Rash  . Aleve [Naproxen Sodium] Itching  CURRENT MEDICATIONS:  Outpatient Encounter Medications as of 07/23/2019  Medication Sig Note  . losartan-hydrochlorothiazide (HYZAAR) 50-12.5 MG per tablet Take 0.5 tablets by mouth every morning.  05/21/2014: Pt took half dose   No facility-administered encounter medications on file as of 07/23/2019.      ONCOLOGIC FAMILY HISTORY:  Family History  Problem  Relation Age of Onset  . Bladder Cancer Sister 29  . Breast cancer Maternal Aunt        age dx unk  . Breast cancer Cousin   . Kidney cancer Cousin        dx 24's  . Breast cancer Other   . Breast cancer Cousin        dx 20's/30's    GENETIC COUNSELING/TESTING: She went last year and went tested and did not receive results yet (genetic counselors left her four messages--she admits she just didn't return their calls)  SOCIAL HISTORY:  Social History   Socioeconomic History  . Marital status: Unknown    Spouse name: Not on file  . Number of children: Not on file  . Years of education: Not on file  . Highest education level: Not on file  Occupational History  . Not on file  Social Needs  . Financial resource strain: Not on file  . Food insecurity    Worry: Not on file    Inability: Not on file  . Transportation needs    Medical: Not on file    Non-medical: Not on file  Tobacco Use  . Smoking status: Former Smoker    Packs/day: 0.25    Years: 4.00    Pack years: 1.00    Types: Cigarettes  . Smokeless tobacco: Never Used  . Tobacco comment: smoked in College for 2-3 years  Substance and Sexual Activity  . Alcohol use: No  . Drug use: No  . Sexual activity: Yes    Birth control/protection: Post-menopausal  Lifestyle  . Physical activity    Days per week: Not on file    Minutes per session: Not on file  . Stress: Not on file  Relationships  . Social Herbalist on phone: Not on file    Gets together: Not on file    Attends religious service: Not on file    Active member of club or organization: Not on file    Attends meetings of clubs or organizations: Not on file    Relationship status: Not on file  . Intimate partner violence    Fear of current or ex partner: Not on file    Emotionally abused: Not on file    Physically abused: Not on file    Forced sexual activity: Not on file  Other Topics Concern  . Not on file  Social History Narrative  . Not  on file     PHYSICAL EXAMINATION:  Vital Signs: Vitals:   07/23/19 1329  BP: 112/70  Pulse: 70  Resp: 18  Temp: 97.8 F (36.6 C)   Filed Weights   07/23/19 1329  Weight: 211 lb 3.2 oz (95.8 kg)   General: Well-nourished, well-appearing female in no acute distress.  Unaccompanied today.   HEENT: Head is normocephalic.  Pupils equal and reactive to light. Conjunctivae clear without exudate.  Sclerae anicteric. Oral mucosa is pink, moist.  Oropharynx is pink without lesions or erythema.  Lymph: No cervical, supraclavicular, or infraclavicular lymphadenopathy noted on palpation.  Cardiovascular: Regular rate and rhythm.Marland Kitchen Respiratory: Clear to auscultation bilaterally. Chest expansion  symmetric; breathing non-labored.  Breast Exam:  -Left breast: No appreciable masses on palpation. No skin redness, thickening, or peau d'orange appearance; no nipple retraction or nipple discharge; mild distortion in symmetry at previous lumpectomy site well healed scar without erythema or nodularity.  -Right breast: No appreciable masses on palpation. No skin redness, thickening, or peau d'orange appearance; no nipple retraction or nipple discharge; -Axilla: No axillary adenopathy bilaterally.  GI: Abdomen soft and round; non-tender, non-distended. Bowel sounds normoactive. No hepatosplenomegaly.   GU: Deferred.  Neuro: No focal deficits. Steady gait.  Psych: Mood and affect normal and appropriate for situation.  MSK: No focal spinal tenderness to palpation, full range of motion in bilateral upper extremities Extremities: No edema. Skin: Warm and dry.  LABORATORY DATA:  None for this visit   DIAGNOSTIC IMAGING:  Most recent mammogram:      ASSESSMENT AND PLAN:  Ms.. Morrison is a pleasant 68 y.o. female with history of Stage IA left breast invasive ductal carcinoma, ER+/PR+/HER2-, diagnosed in 04/2012, treated with lumpectomy, adjuvant radiation therapy, and anti-estrogen therapy with  Anastrozole x 5 years completing therapy in 09/2017.  She presents to the [facility]orship [facility] for surveillance and routine follow-up.   1. History of breast cancer:  Sally Morrison is currently clinically and radiographically without evidence of disease or recurrence of breast cancer. She will be due for mammogram in 06/2020.  She will return in one year for conitnued long term surveillance and monitoring.   I encouraged her to call me with any questions or concerns before her next visit at the cancer center, and I would be happy to see her sooner, if needed.    2. Genetic testing results.  I sent our counselor Roma Kayser a message to gain access to Sally Morrison's results.    3. Bone health:  Given Sally Morrison's age, history of breast cancer, and her previous anti-estrogen therapy with Anastrozole, she is at risk for bone demineralization.  I placed orders for repeat testing today, as she had osteopenia that was quite near osteoporosis.  She was given education on specific food and activities to promote bone health.  4. Cancer screening:  Due to Sally Morrison's history and her age, she should receive screening for skin cancers, colon cancer. She was encouraged to follow-up with her PCP for appropriate cancer screenings.   5. Health maintenance and wellness promotion: Sally Morrison was encouraged to consume 5-7 servings of fruits and vegetables per day. She was also encouraged to engage in moderate to vigorous exercise for 30 minutes per day most days of the week. She was instructed to limit her alcohol consumption and continue to abstain from tobacco use.   Dispo:  -Return to cancer center in one year for LTS follow up -Mammogram in 06/2020  A total of (20) minutes of face-to-face time was spent with this patient with greater than 50% of that time in counseling and care-coordination.   Gardenia Phlegm, NP Survivorship Program [facility] [phone]   Note: PRIMARY CARE  PROVIDER Lucianne Lei, [place] [phone]

## 2019-07-24 ENCOUNTER — Telehealth: Payer: Self-pay | Admitting: Adult Health

## 2019-07-24 NOTE — Telephone Encounter (Signed)
I talk with patient regarding schedule  

## 2019-07-31 DIAGNOSIS — E559 Vitamin D deficiency, unspecified: Secondary | ICD-10-CM | POA: Diagnosis not present

## 2019-07-31 DIAGNOSIS — J309 Allergic rhinitis, unspecified: Secondary | ICD-10-CM | POA: Diagnosis not present

## 2019-07-31 DIAGNOSIS — Z87891 Personal history of nicotine dependence: Secondary | ICD-10-CM | POA: Diagnosis not present

## 2019-07-31 DIAGNOSIS — M170 Bilateral primary osteoarthritis of knee: Secondary | ICD-10-CM | POA: Diagnosis not present

## 2019-07-31 DIAGNOSIS — R69 Illness, unspecified: Secondary | ICD-10-CM | POA: Diagnosis not present

## 2019-07-31 DIAGNOSIS — M109 Gout, unspecified: Secondary | ICD-10-CM | POA: Diagnosis not present

## 2019-07-31 DIAGNOSIS — M8580 Other specified disorders of bone density and structure, unspecified site: Secondary | ICD-10-CM | POA: Diagnosis not present

## 2019-07-31 DIAGNOSIS — I10 Essential (primary) hypertension: Secondary | ICD-10-CM | POA: Diagnosis not present

## 2019-08-19 DIAGNOSIS — I10 Essential (primary) hypertension: Secondary | ICD-10-CM | POA: Diagnosis not present

## 2019-08-19 DIAGNOSIS — D508 Other iron deficiency anemias: Secondary | ICD-10-CM | POA: Diagnosis not present

## 2019-08-19 DIAGNOSIS — E782 Mixed hyperlipidemia: Secondary | ICD-10-CM | POA: Diagnosis not present

## 2019-08-19 DIAGNOSIS — E1169 Type 2 diabetes mellitus with other specified complication: Secondary | ICD-10-CM | POA: Diagnosis not present

## 2019-08-19 DIAGNOSIS — N182 Chronic kidney disease, stage 2 (mild): Secondary | ICD-10-CM | POA: Diagnosis not present

## 2019-08-21 DIAGNOSIS — E1169 Type 2 diabetes mellitus with other specified complication: Secondary | ICD-10-CM | POA: Diagnosis not present

## 2019-08-21 DIAGNOSIS — I10 Essential (primary) hypertension: Secondary | ICD-10-CM | POA: Diagnosis not present

## 2019-08-21 DIAGNOSIS — R0782 Intercostal pain: Secondary | ICD-10-CM | POA: Diagnosis not present

## 2019-09-02 ENCOUNTER — Ambulatory Visit
Admission: RE | Admit: 2019-09-02 | Discharge: 2019-09-02 | Disposition: A | Discharge status: Home / Self Care | Payer: Medicare HMO | Source: Ambulatory Visit | Attending: Family Medicine | Admitting: Family Medicine

## 2019-09-02 ENCOUNTER — Other Ambulatory Visit: Payer: Self-pay | Admitting: Family Medicine

## 2019-09-02 ENCOUNTER — Other Ambulatory Visit: Payer: Self-pay

## 2019-09-02 DIAGNOSIS — R52 Pain, unspecified: Secondary | ICD-10-CM

## 2019-09-02 DIAGNOSIS — R079 Chest pain, unspecified: Secondary | ICD-10-CM | POA: Diagnosis not present

## 2019-12-01 ENCOUNTER — Other Ambulatory Visit: Payer: Self-pay | Admitting: Internal Medicine

## 2019-12-01 DIAGNOSIS — U071 COVID-19: Secondary | ICD-10-CM

## 2019-12-01 NOTE — Progress Notes (Signed)
  I connected by phone with Sally Morrison on 12/01/2019 at 4:39 PM to discuss the potential use of an new treatment for mild to moderate COVID-19 viral infection in non-hospitalized patients.  This patient is a 69 y.o. female that meets the FDA criteria for Emergency Use Authorization of bamlanivimab or casirivimab\imdevimab.  Has a (+) direct SARS-CoV-2 viral test result  Has mild or moderate COVID-19   Is ? 67 years of age and weighs ? 40 kg  Is NOT hospitalized due to COVID-19  Is NOT requiring oxygen therapy or requiring an increase in baseline oxygen flow rate due to COVID-19  Is within 10 days of symptom onset  Has at least one of the high risk factor(s) for progression to severe COVID-19 and/or hospitalization as defined in EUA.  Specific high risk criteria : >/= 67 yo   I have spoken and communicated the following to the patient or parent/caregiver:  1. FDA has authorized the emergency use of bamlanivimab and casirivimab\imdevimab for the treatment of mild to moderate COVID-19 in adults and pediatric patients with positive results of direct SARS-CoV-2 viral testing who are 73 years of age and older weighing at least 40 kg, and who are at high risk for progressing to severe COVID-19 and/or hospitalization.  2. The significant known and potential risks and benefits of bamlanivimab and casirivimab\imdevimab, and the extent to which such potential risks and benefits are unknown.  3. Information on available alternative treatments and the risks and benefits of those alternatives, including clinical trials.  4. Patients treated with bamlanivimab and casirivimab\imdevimab should continue to self-isolate and use infection control measures (e.g., wear mask, isolate, social distance, avoid sharing personal items, clean and disinfect "high touch" surfaces, and frequent handwashing) according to CDC guidelines.   5. The patient or parent/caregiver has the option to accept or refuse  bamlanivimab or casirivimab\imdevimab .  After reviewing this information with the patient, The patient agreed to proceed with receiving the bamlanimivab infusion and will be provided a copy of the Fact sheet prior to receiving the infusion.   Infusion scheduled for 2/9 at 10:30am.   Alan Ripper, NP-C [place]  pgr [phone]

## 2019-12-02 ENCOUNTER — Ambulatory Visit (HOSPITAL_COMMUNITY)
Admission: RE | Admit: 2019-12-02 | Discharge: 2019-12-02 | Disposition: A | Discharge status: Home / Self Care | Payer: Medicare Other | Source: Ambulatory Visit | Attending: Pulmonary Disease | Admitting: Pulmonary Disease

## 2019-12-02 DIAGNOSIS — U071 COVID-19: Secondary | ICD-10-CM | POA: Insufficient documentation

## 2019-12-02 DIAGNOSIS — Z23 Encounter for immunization: Secondary | ICD-10-CM | POA: Diagnosis not present

## 2019-12-02 MED ORDER — FAMOTIDINE IN NACL 20-0.9 MG/50ML-% IV SOLN: 20.0000 mg | Freq: Once | INTRAVENOUS | Status: DC | PRN

## 2019-12-02 MED ORDER — DIPHENHYDRAMINE HCL 50 MG/ML IJ SOLN: 50.0000 mg | Freq: Once | INTRAMUSCULAR | Status: DC | PRN

## 2019-12-02 MED ORDER — SODIUM CHLORIDE 0.9 % IV SOLN
INTRAVENOUS | Status: DC | PRN
  Administered 2019-12-02: 250 mL via INTRAVENOUS

## 2019-12-02 MED ORDER — METHYLPREDNISOLONE SODIUM SUCC 125 MG IJ SOLR: 125.0000 mg | Freq: Once | INTRAMUSCULAR | Status: DC | PRN

## 2019-12-02 MED ORDER — EPINEPHRINE 0.3 MG/0.3ML IJ SOAJ: 0.3000 mg | Freq: Once | INTRAMUSCULAR | Status: DC | PRN

## 2019-12-02 MED ORDER — ALBUTEROL SULFATE HFA 108 (90 BASE) MCG/ACT IN AERS: 2.0000 | INHALATION_SPRAY | Freq: Once | RESPIRATORY_TRACT | Status: DC | PRN

## 2019-12-02 MED ORDER — SODIUM CHLORIDE 0.9 % IV SOLN
700.0000 mg | Freq: Once | INTRAVENOUS | Status: AC
  Administered 2019-12-02: 700 mg via INTRAVENOUS
  Filled 2019-12-02: qty 20

## 2019-12-02 NOTE — Discharge Instructions (Signed)

## 2019-12-02 NOTE — Progress Notes (Signed)
  Diagnosis: COVID-19  Physician:  Procedure: [facility] Med: bamlanivimab infusion - Provided patient with bamlanimivab fact sheet for patients, parents and caregivers prior to infusion.  Complications: No immediate complications noted.  Discharge: Discharged home   Sally Morrison 12/02/2019

## 2020-05-12 ENCOUNTER — Telehealth: Payer: Self-pay | Admitting: Adult Health

## 2020-05-12 NOTE — Telephone Encounter (Signed)
Rescheduled appointment per 7/21 message. Patient is aware of updated appointment date and time.

## 2020-05-25 ENCOUNTER — Other Ambulatory Visit: Payer: Self-pay | Admitting: Family Medicine

## 2020-05-25 DIAGNOSIS — Z1231 Encounter for screening mammogram for malignant neoplasm of breast: Secondary | ICD-10-CM

## 2020-07-06 ENCOUNTER — Other Ambulatory Visit: Payer: Self-pay

## 2020-07-06 ENCOUNTER — Ambulatory Visit
Admission: RE | Admit: 2020-07-06 | Discharge: 2020-07-06 | Disposition: A | Discharge status: Home / Self Care | Payer: Medicare Other | Source: Ambulatory Visit | Attending: Family Medicine | Admitting: Family Medicine

## 2020-07-06 DIAGNOSIS — Z1231 Encounter for screening mammogram for malignant neoplasm of breast: Secondary | ICD-10-CM

## 2020-08-02 ENCOUNTER — Encounter: Payer: Medicare HMO | Admitting: Adult Health

## 2020-08-03 ENCOUNTER — Encounter: Payer: Medicare HMO | Admitting: Adult Health

## 2020-08-12 ENCOUNTER — Encounter: Payer: Medicare HMO | Admitting: Adult Health

## 2020-08-30 IMAGING — CR DG RIBS W/ CHEST 3+V*L*
3 series · 3 of 3 positions shown · non-contrast
Comparison: None.

CLINICAL DATA: Left anterior chest pain

EXAM:
LEFT RIBS AND CHEST - 3+ VIEW

[w chest pa]
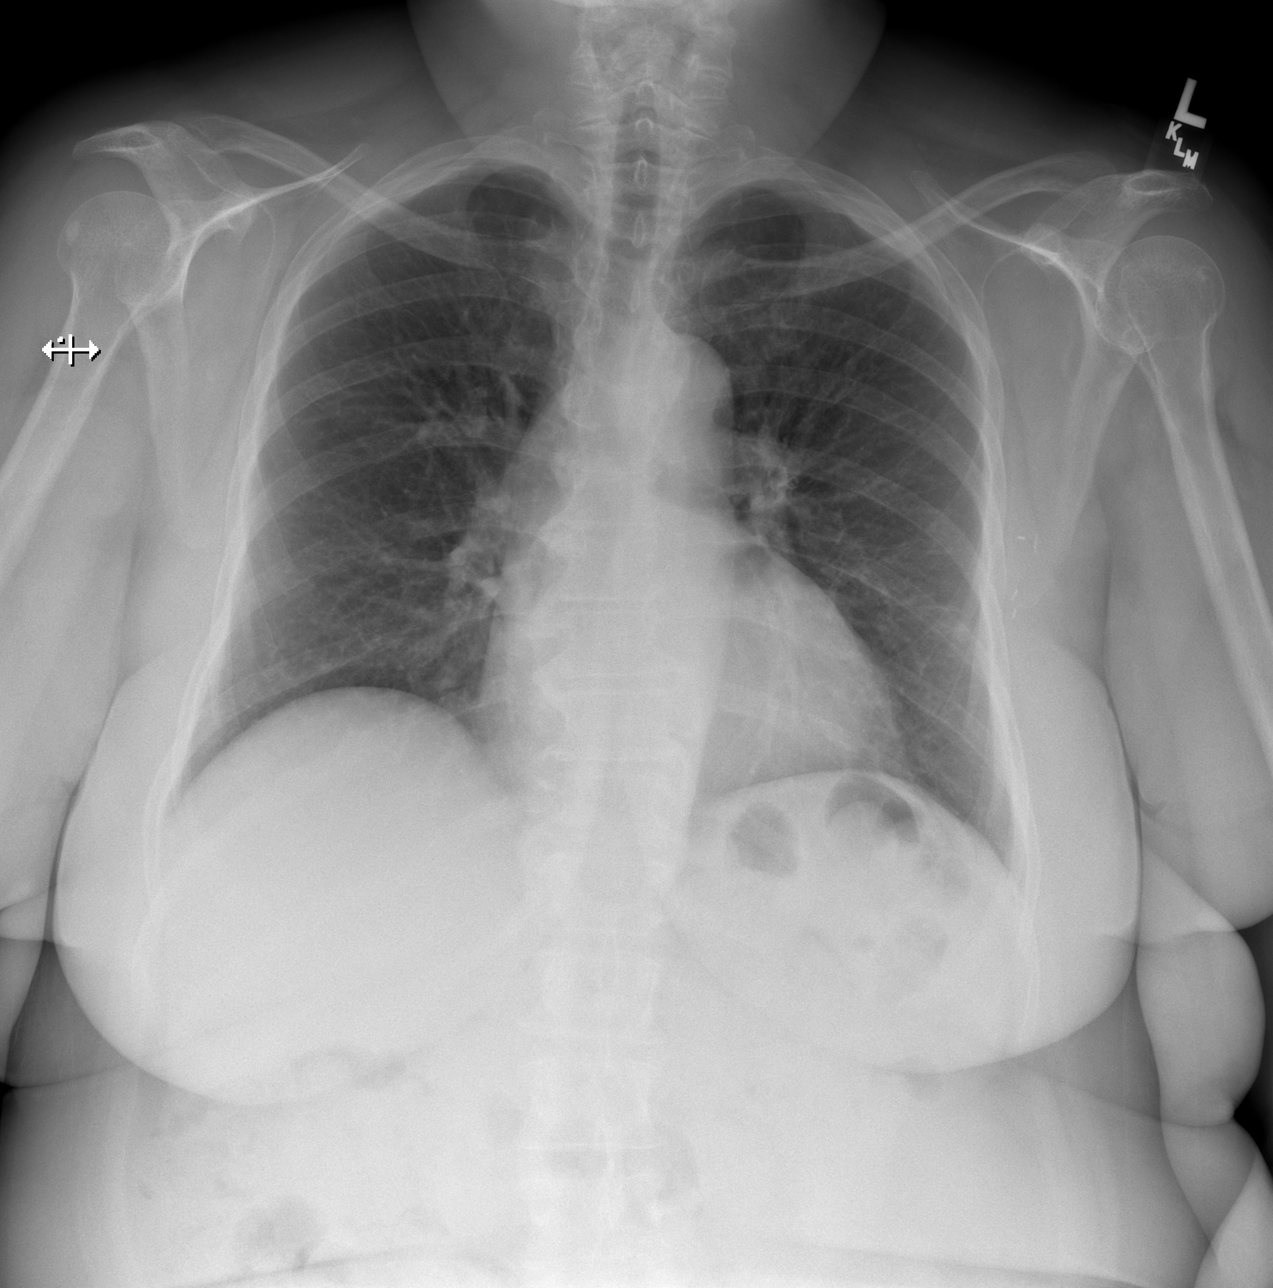

[t ribs ap lower left]
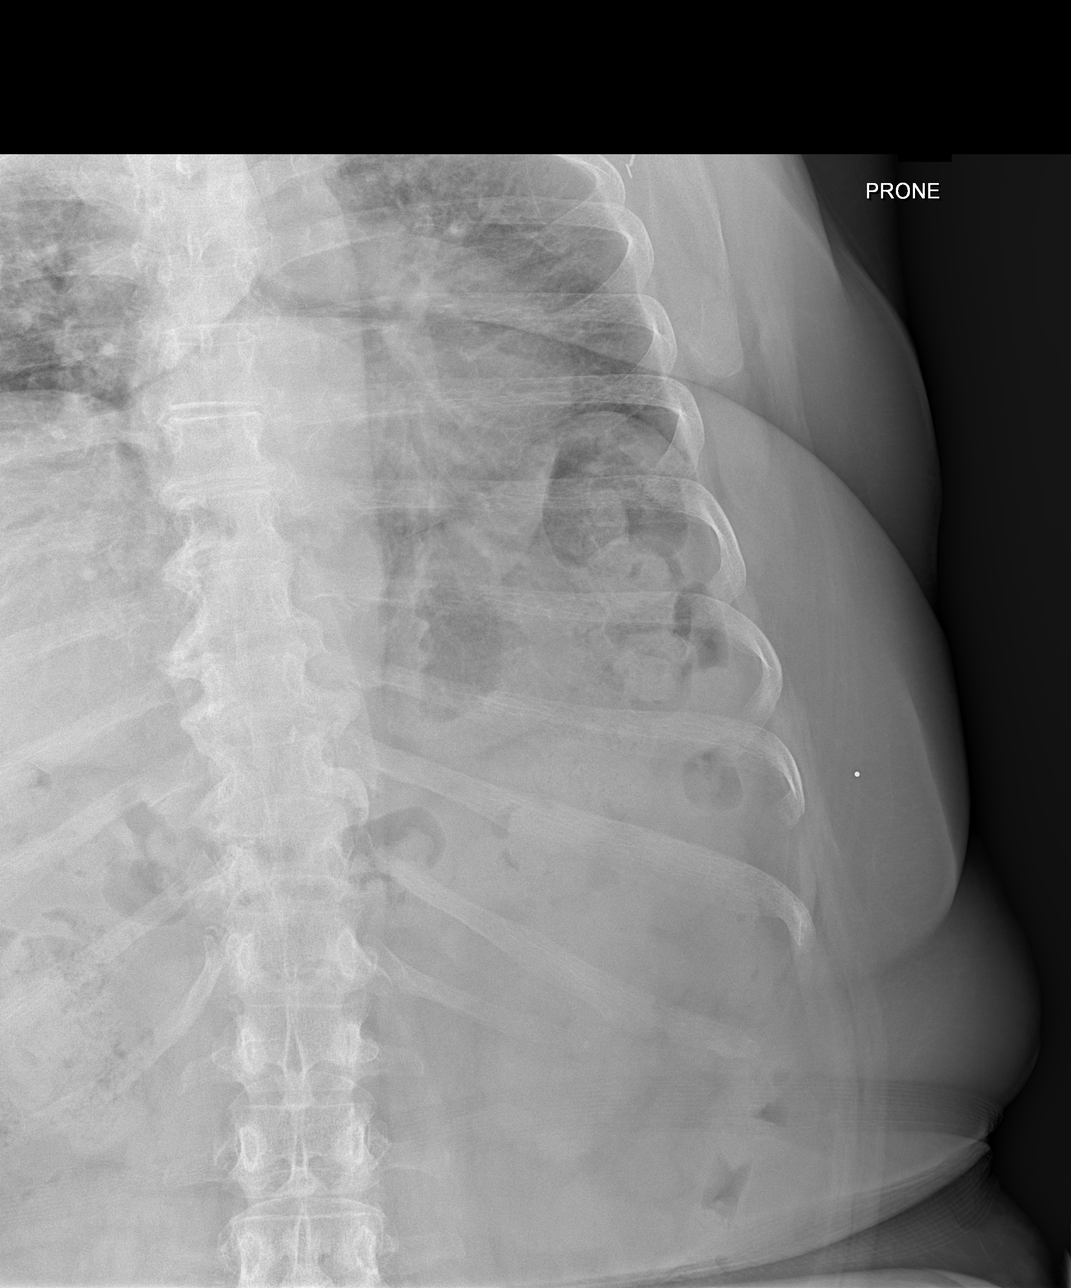

[t ribs lpo left]
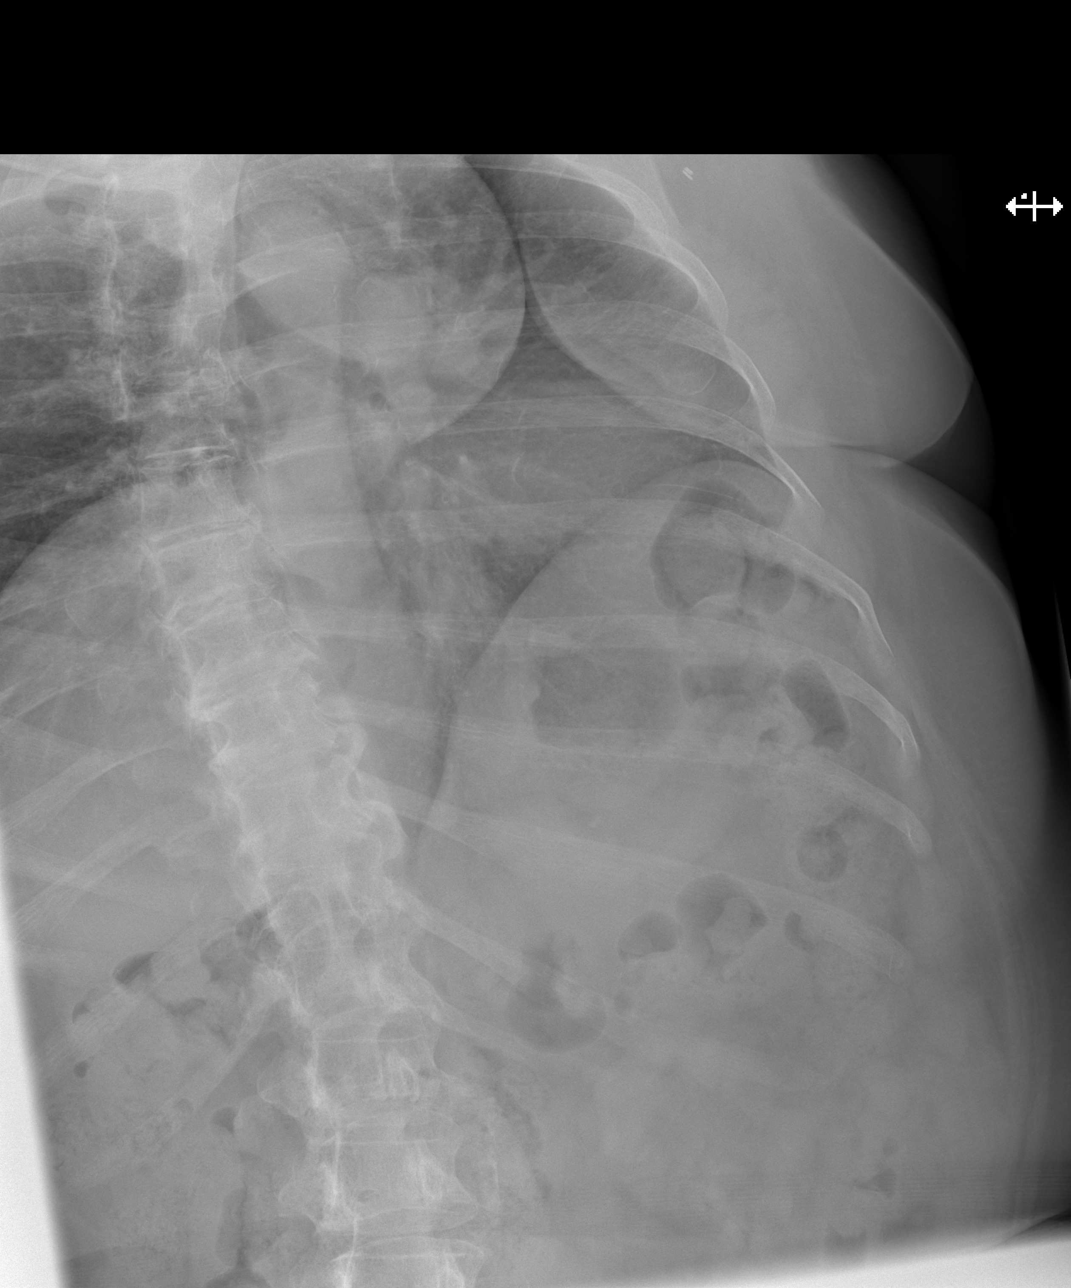

[3 of 3 positions shown; findings below may reference images not displayed]

FINDINGS: Single-view chest demonstrates no acute opacity or pleural effusion.
Normal heart size. No pneumothorax. Clips in the left axilla.

Left rib series demonstrates possible age indeterminate fracture
involving the anterolateral ninth rib.
IMPRESSION: 1. Negative for pneumothorax or pleural effusion
2. Possible age indeterminate left ninth rib fracture

## 2020-09-10 ENCOUNTER — Other Ambulatory Visit: Payer: Self-pay

## 2020-09-10 ENCOUNTER — Inpatient Hospital Stay: Payer: Medicare HMO | Attending: Adult Health | Admitting: Adult Health

## 2020-09-10 VITALS — BP 150/70 | HR 70 | Temp 98.0°F | Resp 18 | Ht 61.0 in | Wt 215.8 lb

## 2020-09-10 DIAGNOSIS — Z923 Personal history of irradiation: Secondary | ICD-10-CM | POA: Insufficient documentation

## 2020-09-10 DIAGNOSIS — Z08 Encounter for follow-up examination after completed treatment for malignant neoplasm: Secondary | ICD-10-CM | POA: Insufficient documentation

## 2020-09-10 DIAGNOSIS — Z87891 Personal history of nicotine dependence: Secondary | ICD-10-CM | POA: Insufficient documentation

## 2020-09-10 DIAGNOSIS — Z170 Estrogen receptor positive status [ER+]: Secondary | ICD-10-CM | POA: Diagnosis not present

## 2020-09-10 DIAGNOSIS — Z853 Personal history of malignant neoplasm of breast: Secondary | ICD-10-CM | POA: Insufficient documentation

## 2020-09-10 DIAGNOSIS — C50412 Malignant neoplasm of upper-outer quadrant of left female breast: Secondary | ICD-10-CM | POA: Diagnosis not present

## 2020-09-10 NOTE — Patient Instructions (Signed)

## 2020-09-12 NOTE — Progress Notes (Signed)
CLINIC:  Survivorship   REASON FOR VISIT:  Routine follow-up for history of breast cancer.   BRIEF ONCOLOGIC HISTORY:  Per Dr. Jana Hakim note from 07/10/17  (1) status post left upper outer quadrant lumpectomy and sentinel lymph node biopsy 05/20/2012 for a pT1c pN0, stage IA invasive ductal carcinoma, grade 1, estrogen receptor 100% positive, progesterone receptor 52% positive, with no HER-2 amplification and an MIB-1 of 8%  (2) Oncotype DX score of 0 predicted a distant disease risk of recurrence over 10 years of approximately 3% if the patient's only systemic treatment was tamoxifen for 5 years. It also predicted no benefit from chemotherapy.  (3) the patient completed adjuvant radiation 09/17/2012, receiving 36 treatments to the left breast using medial and lateral tangential fields to a dose of 5040 cGy, followed by a boost to the postoperative bed of an additional 1440 cGy (given dose 6648).  (4) anastrozole started December 2013, completing 5 years December 2018.  (5) osteopenia -- t-score -2.4 on 06/10/15.              (a) patient opted against Prolia  INTERVAL HISTORY:  Sally Morrison presents to the [facility] today for routine follow-up for her history of breast cancer.  Overall, she reports feeling quite well.  She denies any new issues today.  Her most recent mammogram was completed on 07/06/2020 and showed no evidence of malignancy and breast density category B.   She continues to f/u with her PCP and she is up to date with her health maintenance.  She is exercising.  She has no breast concerns today.    REVIEW OF SYSTEMS:  Review of Systems  Constitutional: Negative for appetite change, chills, fatigue, fever and unexpected weight change.  HENT:   Negative for hearing loss, sore throat and trouble swallowing.   Eyes: Negative for eye problems and icterus.  Respiratory: Negative for chest tightness, cough and shortness of breath.   Cardiovascular: Negative for  chest pain, leg swelling and palpitations.  Gastrointestinal: Negative for abdominal distention, abdominal pain, constipation, diarrhea, nausea and vomiting.  Endocrine: Negative for hot flashes.  Genitourinary: Negative for difficulty urinating.   Musculoskeletal: Negative for arthralgias and back pain.  Skin: Negative for itching and rash.  Neurological: Negative for dizziness, extremity weakness, headaches and numbness.  Hematological: Negative for adenopathy. Does not bruise/bleed easily.  Psychiatric/Behavioral: Negative for depression. The patient is not nervous/anxious.   Breast: Denies any new nodularity, masses, tenderness, nipple changes, or nipple discharge.       PAST MEDICAL/SURGICAL HISTORY:  Past Medical History:  Diagnosis Date  . Allergy   . Anemia   . Breast cancer ([place])   . Cancer ([place])   . Family history of bladder cancer   . Family history of breast cancer   . Family history of kidney cancer   . Hypertension   . Personal history of radiation therapy 2013  . Sickle cell trait [facility])    Past Surgical History:  Procedure Laterality Date  . ABDOMINAL HYSTERECTOMY    . BREAST BIOPSY Left 04/19/2012   malignant  . BREAST LUMPECTOMY Left 05/20/2012  . BREAST SURGERY     left breast lumpectomy  . CHOLECYSTECTOMY    . COLONOSCOPY WITH PROPOFOL N/A 05/21/2014   Procedure: COLONOSCOPY WITH PROPOFOL;  Surgeon: Juanita Craver, MD;  Location: [facility];  Service: Endoscopy;  Laterality: N/A;     ALLERGIES:  Allergies  Allergen Reactions  . Penicillins Rash  . Aleve [Naproxen Sodium] Itching  CURRENT MEDICATIONS:  Outpatient Encounter Medications as of 09/10/2020  Medication Sig Note  . losartan-hydrochlorothiazide (HYZAAR) 50-12.5 MG per tablet Take 0.5 tablets by mouth every morning.  05/21/2014: Pt took half dose   No facility-administered encounter medications on file as of 09/10/2020.     ONCOLOGIC FAMILY HISTORY:  Family History  Problem  Relation Age of Onset  . Bladder Cancer Sister 40  . Breast cancer Maternal Aunt        age dx unk  . Breast cancer Cousin   . Kidney cancer Cousin        dx 21's  . Breast cancer Other   . Breast cancer Cousin        dx 20's/30's    GENETIC COUNSELING/TESTING: She went last year and went tested and did not receive results yet (genetic counselors left her four messages--she admits she just didn't return their calls)  SOCIAL HISTORY:  Social History   Socioeconomic History  . Marital status: Unknown    Spouse name: Not on file  . Number of children: Not on file  . Years of education: Not on file  . Highest education level: Not on file  Occupational History  . Not on file  Tobacco Use  . Smoking status: Former Smoker    Packs/day: 0.25    Years: 4.00    Pack years: 1.00    Types: Cigarettes  . Smokeless tobacco: Never Used  . Tobacco comment: smoked in College for 2-3 years  Substance and Sexual Activity  . Alcohol use: No  . Drug use: No  . Sexual activity: Yes    Birth control/protection: Post-menopausal  Other Topics Concern  . Not on file  Social History Narrative  . Not on file   Social Determinants of Health   Financial Resource Strain:   . Difficulty of Paying Living Expenses: Not on file  Food Insecurity:   . Worried About Charity fundraiser in the Last Year: Not on file  . Ran Out of Food in the Last Year: Not on file  Transportation Needs:   . Lack of Transportation (Medical): Not on file  . Lack of Transportation (Non-Medical): Not on file  Physical Activity:   . Days of Exercise per Week: Not on file  . Minutes of Exercise per Session: Not on file  Stress:   . Feeling of Stress : Not on file  Social Connections:   . Frequency of Communication with Friends and Family: Not on file  . Frequency of Social Gatherings with Friends and Family: Not on file  . Attends Religious Services: Not on file  . Active Member of Clubs or Organizations: Not on  file  . Attends Archivist Meetings: Not on file  . Marital Status: Not on file  Intimate Partner Violence:   . Fear of Current or Ex-Partner: Not on file  . Emotionally Abused: Not on file  . Physically Abused: Not on file  . Sexually Abused: Not on file     PHYSICAL EXAMINATION:  Vital Signs: Vitals:   09/10/20 1608  BP: (!) 150/70  Pulse: 70  Resp: 18  Temp: 98 F (36.7 C)  SpO2: 96%   Filed Weights   09/10/20 1608  Weight: 215 lb 12.8 oz (97.9 kg)   General: Well-nourished, well-appearing female in no acute distress.  Unaccompanied today.   HEENT: Head is normocephalic.  Pupils equal and reactive to light. Conjunctivae clear without exudate.  Sclerae anicteric. Oral mucosa is pink,  moist.  Oropharynx is pink without lesions or erythema.  Lymph: No cervical, supraclavicular, or infraclavicular lymphadenopathy noted on palpation.  Cardiovascular: Regular rate and rhythm.Marland Kitchen Respiratory: Clear to auscultation bilaterally. Chest expansion symmetric; breathing non-labored.  Breast Exam:  -Left breast: No appreciable masses on palpation. No skin redness, thickening, or peau d'orange appearance; no nipple retraction or nipple discharge; mild distortion in symmetry at previous lumpectomy site well healed scar without erythema or nodularity.  -Right breast: No appreciable masses on palpation. No skin redness, thickening, or peau d'orange appearance; no nipple retraction or nipple discharge; -Axilla: No axillary adenopathy bilaterally.  GI: Abdomen soft and round; non-tender, non-distended. Bowel sounds normoactive. No hepatosplenomegaly.   GU: Deferred.  Neuro: No focal deficits. Steady gait.  Psych: Mood and affect normal and appropriate for situation.  MSK: No focal spinal tenderness to palpation, full range of motion in bilateral upper extremities Extremities: No edema. Skin: Warm and dry.  LABORATORY DATA:  None for this visit   DIAGNOSTIC IMAGING:  Most recent  mammogram:   CLINICAL DATA:  Screening.  EXAM: DIGITAL SCREENING BILATERAL MAMMOGRAM WITH TOMO AND CAD  COMPARISON:  Previous exam(s).  ACR Breast Density Category b: There are scattered areas of fibroglandular density.  FINDINGS: There are no findings suspicious for malignancy. Stable post lumpectomy changes on the left. Images were processed with CAD.  IMPRESSION: No mammographic evidence of malignancy. A result letter of this screening mammogram will be mailed directly to the patient.  RECOMMENDATION: Screening mammogram in one year. (Code:SM-B-01Y)  BI-RADS CATEGORY  2: Benign.   Electronically Signed   By: Lajean Manes M.D.   On: 07/07/2020 12:28   ASSESSMENT AND PLAN:  Ms.. Morrison is a pleasant 69 y.o. female with history of Stage IA left breast invasive ductal carcinoma, ER+/PR+/HER2-, diagnosed in 04/2012, treated with lumpectomy, adjuvant radiation therapy, and anti-estrogen therapy with Anastrozole x 5 years completing therapy in 09/2017.  [facility] for [facility] and routine follow-up.   1. History of breast cancer:  Sally Morrison is currently clinically and radiographically without evidence of disease or recurrence of breast cancer. She will be due for mammogram in 06/2021.  She will return in one year for conitnued long term surveillance and monitoring.   I encouraged her to call me with any questions or concerns before her next visit at the cancer center, and I would be happy to see her sooner, if needed.    2. Genetic testing results.  I sent our counselor Roma Kayser another message to gain access to Sally Morrison's results.    3. Bone health:  Given Sally Morrison's age, history of breast cancer, and her previous anti-estrogen therapy with Anastrozole, she is at risk for bone demineralization.  She was given education on specific food and activities to promote bone health.  4. Cancer screening:  Due to Sally Morrison's history and  her age, she should receive screening for skin cancers, colon cancer. She was encouraged to follow-up with her PCP for appropriate cancer screenings.   5. Health maintenance and wellness promotion: Sally Morrison was encouraged to consume 5-7 servings of fruits and vegetables per day. She was also encouraged to engage in moderate to vigorous exercise for 30 minutes per day most days of the week. She was instructed to limit her alcohol consumption and continue to abstain from tobacco use.   Dispo:  -Return to cancer center in one year for LTS follow up -Mammogram in 06/2021  Total encounter time: 30  minutes*  Wilber Bihari, NP 09/12/20 2:10 PM Medical Oncology and Hematology [facility] [place], [place] [address] Tel. [phone]    Fax. [phone]  *Total Encounter Time as defined by the Centers for Medicare and Medicaid Services includes, in addition to the face-to-face time of a patient visit (documented in the note above) non-face-to-face time: obtaining and reviewing outside history, ordering and reviewing medications, tests or procedures, care coordination (communications with other health care professionals or caregivers) and documentation in the medical record.    Note: PRIMARY CARE PROVIDER Lucianne Lei, [place] [phone]

## 2020-09-13 ENCOUNTER — Encounter: Payer: Self-pay | Admitting: Genetic Counselor

## 2020-09-13 DIAGNOSIS — Z1379 Encounter for other screening for genetic and chromosomal anomalies: Secondary | ICD-10-CM | POA: Insufficient documentation

## 2020-09-15 ENCOUNTER — Encounter: Payer: Self-pay | Admitting: Genetic Counselor

## 2021-05-05 ENCOUNTER — Other Ambulatory Visit: Payer: Self-pay | Admitting: Family Medicine

## 2021-05-05 DIAGNOSIS — Z1231 Encounter for screening mammogram for malignant neoplasm of breast: Secondary | ICD-10-CM

## 2021-07-05 ENCOUNTER — Encounter: Payer: Self-pay | Admitting: Nurse Practitioner

## 2021-07-09 ENCOUNTER — Encounter: Payer: Self-pay | Admitting: Nurse Practitioner

## 2021-07-12 ENCOUNTER — Ambulatory Visit
Admission: RE | Admit: 2021-07-12 | Discharge: 2021-07-12 | Disposition: A | Discharge status: Home / Self Care | Payer: Medicare HMO | Source: Ambulatory Visit | Attending: Family Medicine | Admitting: Family Medicine

## 2021-07-12 ENCOUNTER — Other Ambulatory Visit: Payer: Self-pay

## 2021-07-12 DIAGNOSIS — Z1231 Encounter for screening mammogram for malignant neoplasm of breast: Secondary | ICD-10-CM

## 2021-08-09 ENCOUNTER — Encounter: Payer: Self-pay | Admitting: Genetic Counselor

## 2021-08-09 NOTE — Progress Notes (Signed)
Patient never CB to receive information about her test results.  Therefore, a letter about this amended report will not be sent and only documented in her chart. The amended report will be scanned into EPIC.  UPDATE: DICER1 [id number] ([id number]) VUS has been reclassified as LIkely benign.  The amended report date is August 04, 2021.

## 2021-08-26 ENCOUNTER — Other Ambulatory Visit: Payer: Self-pay | Admitting: Nurse Practitioner

## 2021-09-20 ENCOUNTER — Telehealth: Payer: Self-pay | Admitting: Hematology and Oncology

## 2021-09-20 ENCOUNTER — Inpatient Hospital Stay: Payer: Medicare HMO | Attending: Adult Health | Admitting: Adult Health

## 2021-09-20 ENCOUNTER — Other Ambulatory Visit: Payer: Self-pay

## 2021-09-20 VITALS — BP 138/57 | HR 74 | Temp 97.9°F | Resp 16 | Ht 61.0 in | Wt 214.0 lb

## 2021-09-20 DIAGNOSIS — C50412 Malignant neoplasm of upper-outer quadrant of left female breast: Secondary | ICD-10-CM | POA: Diagnosis not present

## 2021-09-20 DIAGNOSIS — M8580 Other specified disorders of bone density and structure, unspecified site: Secondary | ICD-10-CM | POA: Diagnosis not present

## 2021-09-20 DIAGNOSIS — Z170 Estrogen receptor positive status [ER+]: Secondary | ICD-10-CM

## 2021-09-20 DIAGNOSIS — Z923 Personal history of irradiation: Secondary | ICD-10-CM | POA: Diagnosis not present

## 2021-09-20 DIAGNOSIS — Z853 Personal history of malignant neoplasm of breast: Secondary | ICD-10-CM | POA: Insufficient documentation

## 2021-09-20 NOTE — Telephone Encounter (Signed)
Scheduled appointment per 11/29 los. Patient is aware. 

## 2021-09-20 NOTE — Progress Notes (Signed)
CLINIC:  Survivorship   REASON FOR VISIT:  Routine follow-up for history of breast cancer.   BRIEF ONCOLOGIC HISTORY:  Per Dr. Jana Hakim note from 07/10/17  (1) status post left upper outer quadrant lumpectomy and sentinel lymph node biopsy 05/20/2012 for a pT1c pN0, stage IA invasive ductal carcinoma, grade 1, estrogen receptor 100% positive, progesterone receptor 52% positive, with no HER-2 amplification and an MIB-1 of 8%   (2) Oncotype DX score of 0 predicted a distant disease risk of recurrence over 10 years of approximately 3% if the patient's only systemic treatment was tamoxifen for 5 years. It also predicted no benefit from chemotherapy.   (3) the patient completed adjuvant radiation 09/17/2012, receiving 36 treatments to the left breast using medial and lateral tangential fields to a dose of 5040 cGy, followed by a boost to the postoperative bed of an additional 1440 cGy (given dose 6648).   (4) anastrozole started December 2013, completing 5 years December 2018.   (5) osteopenia -- t-score -2.4 on 06/10/15.              (a) patient opted against Prolia  INTERVAL HISTORY:  Sally Morrison presents to the [facility] today for routine follow-up for her history of breast cancer.    Her most recent mammogram was completed on July 18, 2021 showing no evidence of malignancy in breast density category C.  She is doing quite well today.  She notes that she is having no concerns with her breasts.  She continues to see her primary care provider regularly.  She is up-to-date with her cancer screening.  She does try to eat a well-balanced diet.  She is exercising approximately 180 minutes a week.  She does have some arthritis and this time of year her joints hurt a little bit however this is improving.  Overall she is well and without concerns  REVIEW OF SYSTEMS:  Review of Systems  Constitutional:  Negative for appetite change, chills, fatigue, fever and unexpected weight  change.  HENT:   Negative for hearing loss, sore throat and trouble swallowing.   Eyes:  Negative for eye problems and icterus.  Respiratory:  Negative for chest tightness, cough and shortness of breath.   Cardiovascular:  Negative for chest pain, leg swelling and palpitations.  Gastrointestinal:  Negative for abdominal distention, abdominal pain, constipation, diarrhea, nausea and vomiting.  Endocrine: Negative for hot flashes.  Genitourinary:  Negative for difficulty urinating.   Musculoskeletal:  Positive for arthralgias (Mild). Negative for back pain.  Skin:  Negative for itching and rash.  Neurological:  Negative for dizziness, extremity weakness, headaches and numbness.  Hematological:  Negative for adenopathy. Does not bruise/bleed easily.  Psychiatric/Behavioral:  Negative for depression. The patient is not nervous/anxious.  Breast: Denies any new nodularity, masses, tenderness, nipple changes, or nipple discharge.       PAST MEDICAL/SURGICAL HISTORY:  Past Medical History:  Diagnosis Date   Allergy    Anemia    Breast cancer ([place])    Cancer ([place])    Family history of bladder cancer    Family history of breast cancer    Family history of kidney cancer    Hypertension    Personal history of radiation therapy 2013   Sickle cell trait [facility])    Past Surgical History:  Procedure Laterality Date   ABDOMINAL HYSTERECTOMY     BREAST BIOPSY Left 04/19/2012   malignant   BREAST LUMPECTOMY Left 05/20/2012   BREAST SURGERY  left breast lumpectomy   CHOLECYSTECTOMY     COLONOSCOPY WITH PROPOFOL N/A 05/21/2014   Procedure: COLONOSCOPY WITH PROPOFOL;  Surgeon: Juanita Craver, MD;  Location: [facility];  Service: Endoscopy;  Laterality: N/A;     ALLERGIES:  Allergies  Allergen Reactions   Penicillins Rash   Aleve [Naproxen Sodium] Itching     CURRENT MEDICATIONS:  Outpatient Encounter Medications as of 09/20/2021  Medication Sig Note   Ascorbic Acid (VITAMIN C) 1000  MG tablet Take 1,000 mg by mouth daily.    cholecalciferol (VITAMIN D3) 25 MCG (1000 UNIT) tablet Take 1,000 Units by mouth daily.    losartan-hydrochlorothiazide (HYZAAR) 50-12.5 MG per tablet Take 0.5 tablets by mouth every morning.  05/21/2014: Pt took half dose   Quercetin 250 MG TABS Take by mouth.    zinc gluconate 50 MG tablet Take 50 mg by mouth daily.    No facility-administered encounter medications on file as of 09/20/2021.     ONCOLOGIC FAMILY HISTORY:  Family History  Problem Relation Age of Onset   Bladder Cancer Sister 97   Breast cancer Maternal Aunt        age dx unk   Breast cancer Cousin    Kidney cancer Cousin        dx 50's   Breast cancer Other    Breast cancer Cousin        dx 20's/30's    GENETIC COUNSELING/TESTING: She went last year and went tested and did not receive results yet (genetic counselors left her four messages--she admits she just didn't return their calls)  SOCIAL HISTORY:  Social History   Socioeconomic History   Marital status: Widowed    Spouse name: Not on file   Number of children: Not on file   Years of education: Not on file   Highest education level: Not on file  Occupational History   Not on file  Tobacco Use   Smoking status: Former    Packs/day: 0.25    Years: 4.00    Pack years: 1.00    Types: Cigarettes   Smokeless tobacco: Never   Tobacco comments:    smoked in College for 2-3 years  Substance and Sexual Activity   Alcohol use: No   Drug use: No   Sexual activity: Yes    Birth control/protection: Post-menopausal  Other Topics Concern   Not on file  Social History Narrative   Not on file   Social Determinants of Health   Financial Resource Strain: Not on file  Food Insecurity: Not on file  Transportation Needs: Not on file  Physical Activity: Not on file  Stress: Not on file  Social Connections: Not on file  Intimate Partner Violence: Not on file     PHYSICAL EXAMINATION:  Vital Signs: Vitals:    09/20/21 1113  BP: (!) 138/57  Pulse: 74  Resp: 16  Temp: 97.9 F (36.6 C)  SpO2: 99%   Filed Weights   09/20/21 1113  Weight: 214 lb (97.1 kg)   General: Well-nourished, well-appearing female in no acute distress.  Unaccompanied today.   HEENT: Head is normocephalic.  Pupils equal and reactive to light. Conjunctivae clear without exudate.  Sclerae anicteric. Oral mucosa is pink, moist.  Oropharynx is pink without lesions or erythema.  Lymph: No cervical, supraclavicular, or infraclavicular lymphadenopathy noted on palpation.  Cardiovascular: Regular rate and rhythm.Marland Kitchen Respiratory: Clear to auscultation bilaterally. Chest expansion symmetric; breathing non-labored.  Breast Exam:  -Left breast: No appreciable masses on palpation.  No skin redness, thickening, or peau d'orange appearance; no nipple retraction or nipple discharge; mild distortion in symmetry at previous lumpectomy site well healed scar without erythema or nodularity.  -Right breast: No appreciable masses on palpation. No skin redness, thickening, or peau d'orange appearance; no nipple retraction or nipple discharge; -Axilla: No axillary adenopathy bilaterally.  GI: Abdomen soft and round; non-tender, non-distended. Bowel sounds normoactive. No hepatosplenomegaly.   GU: Deferred.  Neuro: No focal deficits. Steady gait.  Psych: Mood and affect normal and appropriate for situation.  MSK: No focal spinal tenderness to palpation, full range of motion in bilateral upper extremities Extremities: No edema. Skin: Warm and dry.  LABORATORY DATA:  None for this visit   DIAGNOSTIC IMAGING:  Most recent mammogram:   CLINICAL DATA:  Screening.   EXAM: DIGITAL SCREENING BILATERAL MAMMOGRAM WITH TOMO AND CAD   COMPARISON:  Previous exam(s).   ACR Breast Density Category b: There are scattered areas of fibroglandular density.   FINDINGS: There are no findings suspicious for malignancy. Stable post lumpectomy changes on the  left. Images were processed with CAD.   IMPRESSION: No mammographic evidence of malignancy. A result letter of this screening mammogram will be mailed directly to the patient.   RECOMMENDATION: Screening mammogram in one year. (Code:SM-B-01Y)   BI-RADS CATEGORY  2: Benign.     Electronically Signed   By: Lajean Manes M.D.   On: 07/07/2020 12:28   ASSESSMENT AND PLAN:  Sally Morrison is a pleasant 70 y.o. female with history of Stage IA left breast invasive ductal carcinoma, ER+/PR+/HER2-, diagnosed in 04/2012, treated with lumpectomy, adjuvant radiation therapy, and anti-estrogen therapy with Anastrozole x 5 years completing therapy in 09/2017.  She [facility] for surveillance and routine follow-up.   1. History of breast cancer:  Ms. Heeg is currently clinically and radiographically without evidence of disease or recurrence of breast cancer. She will be due for mammogram in 07/2022.  She will return in one year for conitnued long term surveillance and monitoring.   I encouraged her to call me with any questions or concerns before her next visit at the cancer center, and I would be happy to see her sooner, if needed.    2. Bone health:  She was given education on specific food and activities to promote bone health.  4. Cancer screening:  Due to Sally Morrison's history and her age, she should receive screening for skin cancers, colon cancer. She was encouraged to follow-up with her PCP for appropriate cancer screenings.   5. Health maintenance and wellness promotion: Sally Morrison was encouraged to consume 5-7 servings of fruits and vegetables per day. She was also encouraged to engage in moderate to vigorous exercise for 30 minutes per day most days of the week. She was instructed to limit her alcohol consumption and continue to abstain from tobacco use.   Dispo:  -Return to cancer center in one year for LTS follow up -Mammogram in 07/2022  Total encounter  time: 20 minutes*in face-to-face visit time, chart review, lab review, care coordination, and documentation of the encounter  Wilber Bihari, NP 09/20/21 11:50 AM Medical Oncology and Hematology [facility] [address] [place], [place] [address] Tel. [phone]    Fax. [phone]  *Total Encounter Time as defined by the Centers for Medicare and Medicaid Services includes, in addition to the face-to-face time of a patient visit (documented in the note above) non-face-to-face time: obtaining and reviewing outside history, ordering and  reviewing medications, tests or procedures, care coordination (communications with other health care professionals or caregivers) and documentation in the medical record.    Note: PRIMARY CARE PROVIDER Lucianne Lei, [place] [phone]

## 2022-06-15 ENCOUNTER — Other Ambulatory Visit: Payer: Self-pay | Admitting: Family Medicine

## 2022-06-15 DIAGNOSIS — Z1231 Encounter for screening mammogram for malignant neoplasm of breast: Secondary | ICD-10-CM

## 2022-07-13 ENCOUNTER — Ambulatory Visit: Payer: Medicare HMO

## 2022-07-27 ENCOUNTER — Ambulatory Visit
Admission: RE | Admit: 2022-07-27 | Discharge: 2022-07-27 | Disposition: A | Discharge status: Home / Self Care | Payer: Medicare HMO | Source: Ambulatory Visit | Attending: Family Medicine | Admitting: Family Medicine

## 2022-07-27 DIAGNOSIS — Z1231 Encounter for screening mammogram for malignant neoplasm of breast: Secondary | ICD-10-CM

## 2022-09-21 ENCOUNTER — Telehealth: Payer: Self-pay

## 2022-09-21 ENCOUNTER — Encounter: Payer: Self-pay | Admitting: Adult Health

## 2022-09-21 ENCOUNTER — Inpatient Hospital Stay: Payer: Medicare HMO | Attending: Adult Health | Admitting: Adult Health

## 2022-09-21 VITALS — BP 138/80 | HR 66 | Temp 97.9°F | Resp 18 | Ht 61.0 in | Wt 204.7 lb

## 2022-09-21 DIAGNOSIS — Z853 Personal history of malignant neoplasm of breast: Secondary | ICD-10-CM | POA: Insufficient documentation

## 2022-09-21 DIAGNOSIS — C50412 Malignant neoplasm of upper-outer quadrant of left female breast: Secondary | ICD-10-CM

## 2022-09-21 DIAGNOSIS — I10 Essential (primary) hypertension: Secondary | ICD-10-CM

## 2022-09-21 DIAGNOSIS — Z87891 Personal history of nicotine dependence: Secondary | ICD-10-CM | POA: Diagnosis not present

## 2022-09-21 DIAGNOSIS — M8580 Other specified disorders of bone density and structure, unspecified site: Secondary | ICD-10-CM | POA: Insufficient documentation

## 2022-09-21 DIAGNOSIS — Z923 Personal history of irradiation: Secondary | ICD-10-CM | POA: Insufficient documentation

## 2022-09-21 DIAGNOSIS — Z170 Estrogen receptor positive status [ER+]: Secondary | ICD-10-CM

## 2022-09-21 NOTE — Telephone Encounter (Signed)
Called Dr. Fransico Setters office and requested Immunization Record and AWV.  Waiting on FAX.

## 2022-09-21 NOTE — Progress Notes (Signed)
Sally Morrison, Sally Morrison   DIAGNOSIS:  Cancer Staging  Malignant neoplasm of upper-outer quadrant of left breast in female, estrogen receptor positive ([place]) Staging form: Breast, AJCC 7th Edition - Clinical: Stage IA (T1c, N0, M0) - Unsigned   SUMMARY OF ONCOLOGIC HISTORY:  (1) status post left upper outer quadrant lumpectomy and sentinel lymph node biopsy 05/20/2012 for a pT1c pN0, stage IA invasive ductal carcinoma, grade 1, estrogen receptor 100% positive, progesterone receptor 52% positive, with no HER-2 amplification and an MIB-1 of 8%   (2) Oncotype DX score of 0 predicted a distant disease risk of recurrence over 10 years of approximately 3% if the patient's only systemic treatment was tamoxifen for 5 years. It also predicted no benefit from chemotherapy.   (3) the patient completed adjuvant radiation 09/17/2012, receiving 36 treatments to the left breast using medial and lateral tangential fields to a dose of 5040 cGy, followed by a boost to the postoperative bed of an additional 1440 cGy (given dose 6648).   (4) anastrozole started December 2013, completing 5 years December 2018.   (5) osteopenia -- t-score -2.4 on 06/10/15.              (a) patient opted against Prolia  CURRENT THERAPY: Observation  INTERVAL HISTORY: Sally Morrison 71 y.o. female returns for follow-up of her history of left-sided breast cancer.  Her most recent mammogram occurred on July 27, 2022.  It showed no findings suspicious for malignancy and breast density category B.  She is seeing her PCP regularly.  She is up to date with cancer screenings.  She is exercising on Tuesday and Thursday by going to the gym.  She is thinking about adding more time.     Patient Active Problem List   Diagnosis Date Noted   Morbid (severe) obesity due to excess calories ([place]) 09/21/2022   Essential hypertension 09/21/2022   Genetic  testing 09/13/2020   Family history of breast cancer    Family history of bladder cancer    Family history of kidney cancer    Osteopenia 06/21/2015   Malignant neoplasm of upper-outer quadrant of left breast in female, estrogen receptor positive ([place]) 07/31/2013    is allergic to penicillins and aleve [naproxen sodium].  MEDICAL HISTORY: Past Medical History:  Diagnosis Date   Allergy    Anemia    Breast cancer ([place])    Cancer ([place])    Family history of bladder cancer    Family history of breast cancer    Family history of kidney cancer    Hypertension    Personal history of radiation therapy 2013   Sickle cell trait ([place])     SURGICAL HISTORY: Past Surgical History:  Procedure Laterality Date   ABDOMINAL HYSTERECTOMY     BREAST BIOPSY Left 04/19/2012   malignant   BREAST LUMPECTOMY Left 05/20/2012   BREAST SURGERY     left breast lumpectomy   CHOLECYSTECTOMY     COLONOSCOPY WITH PROPOFOL N/A 05/21/2014   Procedure: COLONOSCOPY WITH PROPOFOL;  Surgeon: Juanita Craver, MD;  Location: [facility];  Service: Endoscopy;  Laterality: N/A;    SOCIAL HISTORY: Social History   Socioeconomic History   Marital status: Widowed    Spouse name: Not on file   Number of children: Not on file   Years of education: Not on file   Highest education level: Not on file  Occupational History   Not on file  Tobacco Use   Smoking status: Former    Packs/day: 0.25    Years: 4.00    Total pack years: 1.00    Types: Cigarettes   Smokeless tobacco: Never   Tobacco comments:    smoked in College for 2-3 years  Substance and Sexual Activity   Alcohol use: No   Drug use: No   Sexual activity: Yes    Birth control/protection: Post-menopausal  Other Topics Concern   Not on file  Social History Narrative   Not on file   Social Determinants of Health   Financial Resource Strain: Not on file  Food Insecurity: Not on file  Transportation Needs: Not on file  Physical Activity: Not  on file  Stress: Not on file  Social Connections: Not on file  Intimate Partner Violence: Not on file    FAMILY HISTORY: Family History  Problem Relation Age of Onset   Bladder Cancer Sister 78   Breast cancer Maternal Aunt        age dx unk   Breast cancer Cousin    Kidney cancer Cousin        dx 50's   Breast cancer Other    Breast cancer Cousin        dx 20's/30's    Review of Systems  Constitutional:  Negative for appetite change, chills, fatigue, fever and unexpected weight change.  HENT:   Negative for hearing loss, lump/mass and trouble swallowing.   Eyes:  Negative for eye problems and icterus.  Respiratory:  Negative for chest tightness, cough and shortness of breath.   Cardiovascular:  Negative for chest pain, leg swelling and palpitations.  Gastrointestinal:  Negative for abdominal distention, abdominal pain, constipation, diarrhea, nausea and vomiting.  Endocrine: Negative for hot flashes.  Genitourinary:  Negative for difficulty urinating.   Musculoskeletal:  Negative for arthralgias.  Skin:  Negative for itching and rash.  Neurological:  Negative for dizziness, extremity weakness, headaches and numbness.  Hematological:  Negative for adenopathy. Does not bruise/bleed easily.  Psychiatric/Behavioral:  Negative for depression. The patient is not nervous/anxious.       PHYSICAL EXAMINATION  ECOG PERFORMANCE STATUS: 1 - Symptomatic but completely ambulatory  Vitals:   09/21/22 1009 09/21/22 1057  BP: (!) 146/66 138/80  Pulse: 66   Resp: 18   Temp: 97.9 F (36.6 C)   SpO2: 98%     Physical Exam Constitutional:      General: She is not in acute distress.    Appearance: Normal appearance. She is not toxic-appearing.  HENT:     Head: Normocephalic and atraumatic.  Eyes:     General: No scleral icterus. Cardiovascular:     Rate and Rhythm: Normal rate and regular rhythm.     Pulses: Normal pulses.     Heart sounds: Normal heart sounds.  Pulmonary:      Effort: Pulmonary effort is normal.     Breath sounds: Normal breath sounds.  Chest:     Comments: Left breast status postlumpectomy and radiation no sign of local recurrence right breast is benign. Abdominal:     General: Abdomen is flat. Bowel sounds are normal. There is no distension.     Palpations: Abdomen is soft.     Tenderness: There is no abdominal tenderness.  Musculoskeletal:        General: No swelling.     Cervical back: Neck supple.  Lymphadenopathy:     Cervical: No cervical adenopathy.  Skin:    General: Skin is warm and dry.     Findings: No rash.  Neurological:     General: No focal deficit present.     Mental Status: She is alert.  Psychiatric:        Mood and Affect: Mood normal.        Behavior: Behavior normal.     LABORATORY DATA: None for this visit     ASSESSMENT and THERAPY PLAN:   Malignant neoplasm of upper-outer quadrant of left breast in female, estrogen receptor positive ([place]) Sally Morrison is a 71 year old woman with history of stage Ia ER/PR positive breast cancer diagnosed in 2013 status post lumpectomy, adjuvant radiation, and 5 years of antiestrogen therapy with anastrozole that she completed in December 2018.  She has no clinical or radiographic sign of breast cancer recurrence.  She will continue with annual mammograms next due in October 2024.  We reviewed her health maintenance in detail and many of her immunizations are not entered into our health maintenance system.  We have requested records from [name] to help get this updated.  She is up-to-date with her cancer screenings and no longer requires Pap smears as she is status post hysterectomy and her most recent Pap smear in 2019 was negative for abnormal cells.  We discussed healthy diet and exercise and she is planning on increasing her activity from 2 days a week at the gym to 3 days a week at the gym.  I recommended continued healthy diet with plenty of fruits and vegetables  limiting processed foods and added sugars.  Will return in 1 year for continued long-term follow-up.  She knows to call in the meantime if she wants to be seen sooner as we can certainly accommodate this.  All questions were answered. The patient knows to call the clinic with any problems, questions or concerns. We can certainly see the patient much sooner if necessary.  Total encounter time:20 minutes*in face-to-face visit time, chart review, lab review, care coordination, order entry, and documentation of the encounter time.  Wilber Bihari, NP 09/21/22 11:05 AM Medical Oncology and Hematology [facility] [place], [place] [address] Tel. [phone]    Fax. [phone]  *Total Encounter Time as defined by the Centers for Medicare and Medicaid Services includes, in addition to the face-to-face time of a patient visit (documented in the note above) non-face-to-face time: obtaining and reviewing outside history, ordering and reviewing medications, tests or procedures, care coordination (communications with other health care professionals or caregivers) and documentation in the medical record.

## 2022-09-21 NOTE — Assessment & Plan Note (Signed)
Sally Morrison is a 71 year old woman with history of stage Ia ER/PR positive breast cancer diagnosed in 2013 status post lumpectomy, adjuvant radiation, and 5 years of antiestrogen therapy with anastrozole that she completed in December 2018.  She has no clinical or radiographic sign of breast cancer recurrence.  She will continue with annual mammograms next due in October 2024.  We reviewed her health maintenance in detail and many of her immunizations are not entered into our health maintenance system.  We have requested records from [name] to help get this updated.  She is up-to-date with her cancer screenings and no longer requires Pap smears as she is status post hysterectomy and her most recent Pap smear in 2019 was negative for abnormal cells.  We discussed healthy diet and exercise and she is planning on increasing her activity from 2 days a week at the gym to 3 days a week at the gym.  I recommended continued healthy diet with plenty of fruits and vegetables limiting processed foods and added sugars.  Will return in 1 year for continued long-term follow-up.  She knows to call in the meantime if she wants to be seen sooner as we can certainly accommodate this.

## 2023-03-31 LAB — AMB RESULTS CONSOLE CBG: Glucose: 122

## 2023-03-31 NOTE — Progress Notes (Signed)
Pt  has PCP. No SDOH needs at this time.  

## 2023-05-03 NOTE — Progress Notes (Signed)
HEALTH EQUITY SCREENING EVENT  DATE: 03/31/2023 SCREENING: B/P   125/75                GLUCOSE : 122 SDOH NEEDS: No further assistance have been requested from the Health Equity team at this time.  RESOURCES GIVEN: No information was given or is needed at this time. PER CHART REVIEW/PCP: The PT. was verified as active, via telephone on 05/03/2023 by staff at the Physician's office. PT. Has been seen within the past 12 months, verified by staff.  APPOINTMENTS: Chart shows future Specialist appointments with the Medical Care Team. No addidtional Health Equity Team support indicated at this time.

## 2023-06-20 ENCOUNTER — Other Ambulatory Visit: Payer: Self-pay | Admitting: Family Medicine

## 2023-06-20 DIAGNOSIS — Z1231 Encounter for screening mammogram for malignant neoplasm of breast: Secondary | ICD-10-CM

## 2023-07-30 ENCOUNTER — Ambulatory Visit
Admission: RE | Admit: 2023-07-30 | Discharge: 2023-07-30 | Disposition: A | Discharge status: Home / Self Care | Payer: Medicare HMO | Source: Ambulatory Visit | Attending: Family Medicine | Admitting: Family Medicine

## 2023-07-30 DIAGNOSIS — Z1231 Encounter for screening mammogram for malignant neoplasm of breast: Secondary | ICD-10-CM

## 2023-09-25 ENCOUNTER — Inpatient Hospital Stay: Payer: Medicare HMO | Attending: Adult Health | Admitting: Adult Health

## 2023-09-25 VITALS — BP 137/65 | HR 64 | Temp 97.9°F | Resp 18 | Ht 61.0 in | Wt 203.1 lb

## 2023-09-25 DIAGNOSIS — Z803 Family history of malignant neoplasm of breast: Secondary | ICD-10-CM | POA: Insufficient documentation

## 2023-09-25 DIAGNOSIS — Z87891 Personal history of nicotine dependence: Secondary | ICD-10-CM | POA: Insufficient documentation

## 2023-09-25 DIAGNOSIS — C50412 Malignant neoplasm of upper-outer quadrant of left female breast: Secondary | ICD-10-CM

## 2023-09-25 DIAGNOSIS — M8580 Other specified disorders of bone density and structure, unspecified site: Secondary | ICD-10-CM | POA: Insufficient documentation

## 2023-09-25 DIAGNOSIS — Z923 Personal history of irradiation: Secondary | ICD-10-CM | POA: Diagnosis not present

## 2023-09-25 DIAGNOSIS — Z853 Personal history of malignant neoplasm of breast: Secondary | ICD-10-CM | POA: Diagnosis present

## 2023-09-25 DIAGNOSIS — Z170 Estrogen receptor positive status [ER+]: Secondary | ICD-10-CM | POA: Diagnosis not present

## 2023-09-25 NOTE — Assessment & Plan Note (Signed)
Ercell is a 72 year old woman with history of stage Ia ER/PR positive breast cancer diagnosed in 2013 status post lumpectomy, adjuvant radiation, and 5 years of antiestrogen therapy with anastrozole that she completed in December 2018.  Breast Cancer History No new breast concerns or pain. Normal physical examination. Last mammogram in October 2024 was normal. No clinical or radiographic signs of breast cancer recurrence. -Schedule next mammogram for October 2025. -Return for annual visit next year unless new concerns arise.  Lifestyle and Diet Discussed importance of incorporating fruits, vegetables, beans, and nuts into diet. Limiting processed foods and maintaining regular exercise. -Continue current efforts in diet and exercise. -Provide patient with printout on diet and exercise recommendations.  Will return in 1 year for continued long-term follow-up.  She knows to call in the meantime if she wants to be seen sooner as we can certainly accommodate this.

## 2023-09-25 NOTE — Progress Notes (Signed)
[facility] Cancer Follow up:    Sally Rakers, MD [address] [place] [address]   DIAGNOSIS:  Cancer Staging  Malignant neoplasm of upper-outer quadrant of left breast in female, estrogen receptor positive ([facility]) Staging form: Breast, AJCC 7th Edition - Clinical: Stage IA (T1c, N0, M0) - Unsigned   SUMMARY OF ONCOLOGIC HISTORY: (1) status post left upper outer quadrant lumpectomy and sentinel lymph node biopsy 05/20/2012 for a pT1c pN0, stage IA invasive ductal carcinoma, grade 1, estrogen receptor 100% positive, progesterone receptor 52% positive, with no HER-2 amplification and an MIB-1 of 8%   (2) Oncotype DX score of 0 predicted a distant disease risk of recurrence over 10 years of approximately 3% if the patient's only systemic treatment was tamoxifen for 5 years. It also predicted no benefit from chemotherapy.   (3) the patient completed adjuvant radiation 09/17/2012, receiving 36 treatments to the left breast using medial and lateral tangential fields to a dose of 5040 cGy, followed by a boost to the postoperative bed of an additional 1440 cGy (given dose 6648).   (4) anastrozole started December 2013, completing 5 years December 2018.   (5) osteopenia -- t-score -2.4 on 06/10/15.              (a) patient opted against Prolia  CURRENT THERAPY: observation  INTERVAL HISTORY:  Discussed the use of AI scribe software for clinical note transcription with the patient, who gave verbal consent to proceed.  Sally Morrison 72 y.o. female diagnosed with cancer in 2014, presents for a routine follow-up. She reports no significant health changes or new symptoms since the last visit. 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  The patient has been attempting to follow a low-carb diet, favoring berries when consuming fruits. She acknowledges the need to increase her intake  of fruits and vegetables and limit processed foods. She has been trying to incorporate more beans and nuts into her diet.  The patient identifies as a night owl and acknowledges the need to improve her sleep habits. She has no new complaints of pain or discomfort. Her most recent mammogram in October, 2024 & showed no concerning findings. She has been making lifestyle changes and reports feeling well overall.   Patient Active Problem List   Diagnosis Date Noted   Morbid (severe) obesity due to excess calories ([facility]) 09/21/2022   Essential hypertension 09/21/2022   Genetic testing 09/13/2020   Family history of breast cancer    Family history of bladder cancer    Family history of kidney cancer    Osteopenia 06/21/2015   Malignant neoplasm of upper-outer quadrant of left breast in female, estrogen receptor positive ([facility]) 07/31/2013    is allergic to penicillins and aleve [naproxen sodium].  MEDICAL HISTORY: Past Medical History:  Diagnosis Date   Allergy    Anemia    Breast cancer ([facility])    Cancer ([facility])    Family history of bladder cancer    Family history of breast cancer    Family history of kidney cancer    Hypertension    Personal history of radiation therapy 2013   Sickle cell trait ([facility])     SURGICAL HISTORY: Past Surgical History:  Procedure Laterality Date   ABDOMINAL HYSTERECTOMY     BREAST BIOPSY Left 04/19/2012   malignant   BREAST LUMPECTOMY Left 05/20/2012   BREAST SURGERY  left breast lumpectomy   CHOLECYSTECTOMY     COLONOSCOPY WITH PROPOFOL N/A 05/21/2014   Procedure: COLONOSCOPY WITH PROPOFOL;  Surgeon: Charna Elizabeth, MD;  Location: [facility];  Service: Endoscopy;  Laterality: N/A;    SOCIAL HISTORY: Social History   Socioeconomic History   Marital status: Widowed    Spouse name: Not on file   Number of children: Not on file   Years of education: Not on file   Highest education level: Not on file  Occupational History   Not on file  Tobacco  Use   Smoking status: Former    Current packs/day: 0.25    Average packs/day: 0.3 packs/day for 4.0 years (1.0 ttl pk-yrs)    Types: Cigarettes   Smokeless tobacco: Never   Tobacco comments:    smoked in College for 2-3 years  Substance and Sexual Activity   Alcohol use: No   Drug use: No   Sexual activity: Yes    Birth control/protection: Post-menopausal  Other Topics Concern   Not on file  Social History Narrative   Not on file   Social Determinants of Health   Financial Resource Strain: Not on file  Food Insecurity: No Food Insecurity (03/31/2023)   Hunger Vital Sign    Worried About Running Out of Food in the Last Year: Never true    Ran Out of Food in the Last Year: Never true  Transportation Needs: No Transportation Needs (03/31/2023)   PRAPARE - Administrator, Civil Service (Medical): No    Lack of Transportation (Non-Medical): No  Physical Activity: Not on file  Stress: Not on file  Social Connections: Not on file  Intimate Partner Violence: Not At Risk (03/31/2023)   Humiliation, Afraid, Rape, and Kick questionnaire    Fear of Current or Ex-Partner: No    Emotionally Abused: No    Physically Abused: No    Sexually Abused: No    FAMILY HISTORY: Family History  Problem Relation Age of Onset   Bladder Cancer Sister 27   Breast cancer Maternal Aunt        age dx unk   Breast cancer Cousin    Kidney cancer Cousin        dx 50's   Breast cancer Other    Breast cancer Cousin        dx 20's/30's    Review of Systems  Constitutional:  Negative for appetite change, chills, fatigue, fever and unexpected weight change.  HENT:   Negative for hearing loss, lump/mass and trouble swallowing.   Eyes:  Negative for eye problems and icterus.  Respiratory:  Negative for chest tightness, cough and shortness of breath.   Cardiovascular:  Negative for chest pain, leg swelling and palpitations.  Gastrointestinal:  Negative for abdominal distention, abdominal pain,  constipation, diarrhea, nausea and vomiting.  Endocrine: Negative for hot flashes.  Genitourinary:  Negative for difficulty urinating.   Musculoskeletal:  Negative for arthralgias.  Skin:  Negative for itching and rash.  Neurological:  Negative for dizziness, extremity weakness, headaches and numbness.  Hematological:  Negative for adenopathy. Does not bruise/bleed easily.  Psychiatric/Behavioral:  Negative for depression. The patient is not nervous/anxious.       PHYSICAL EXAMINATION    Vitals:   09/25/23 0946  BP: 137/65  Pulse: 64  Resp: 18  Temp: 97.9 F (36.6 C)  SpO2: 100%    Physical Exam Constitutional:      General: She is not in acute distress.  Appearance: Normal appearance. She is not toxic-appearing.  HENT:     Head: Normocephalic and atraumatic.     Mouth/Throat:     Mouth: Mucous membranes are moist.     Pharynx: Oropharynx is clear. No oropharyngeal exudate or posterior oropharyngeal erythema.  Eyes:     General: No scleral icterus. Cardiovascular:     Rate and Rhythm: Normal rate and regular rhythm.     Pulses: Normal pulses.     Heart sounds: Normal heart sounds.  Pulmonary:     Effort: Pulmonary effort is normal.     Breath sounds: Normal breath sounds.  Chest:     Comments: Left breast s/p lumpectomy and radiation, no sign of local recurrence, right breast benign Abdominal:     General: Abdomen is flat. Bowel sounds are normal. There is no distension.     Palpations: Abdomen is soft.     Tenderness: There is no abdominal tenderness.  Musculoskeletal:        General: No swelling.     Cervical back: Neck supple.  Lymphadenopathy:     Cervical: No cervical adenopathy.     Upper Body:     Right upper body: No axillary adenopathy.     Left upper body: No axillary adenopathy.  Skin:    General: Skin is warm and dry.     Findings: No rash.  Neurological:     General: No focal deficit present.     Mental Status: She is alert.  Psychiatric:         Mood and Affect: Mood normal.        Behavior: Behavior normal.        ASSESSMENT and THERAPY PLAN:   Malignant neoplasm of upper-outer quadrant of left breast in female, estrogen receptor positive ([facility]) Sally Morrison is a 72 year old woman with history of stage Ia ER/PR positive breast cancer diagnosed in 2013 status post lumpectomy, adjuvant radiation, and 5 years of antiestrogen therapy with anastrozole that she completed in December 2018.  Breast Cancer History No new breast concerns or pain. Normal physical examination. Last mammogram in October 2024 was normal. No clinical or radiographic signs of breast cancer recurrence. -Schedule next mammogram for October 2025. -Return for annual visit next year unless new concerns arise.  Lifestyle and Diet Discussed importance of incorporating fruits, vegetables, beans, and nuts into diet. Limiting processed foods and maintaining regular exercise. -Continue current efforts in diet and exercise. -Provide patient with printout on diet and exercise recommendations.  Will return in 1 year for continued long-term follow-up.  She knows to call in the meantime if she wants to be seen sooner as we can certainly accommodate this.  All questions were answered. The patient knows to call the clinic with any problems, questions or concerns. We can certainly see the patient much sooner if necessary.  Total encounter time:30 minutes*in face-to-face visit time, chart review, lab review, care coordination, order entry, and documentation of the encounter time.    Lillard Anes, NP 09/25/23 1:37 PM Medical Oncology and Hematology [facility] [address] [place], [place] [address] Tel. [phone]    Fax. [phone]  *Total Encounter Time as defined by the Centers for Medicare and Medicaid Services includes, in addition to the face-to-face time of a patient visit (documented in the note above) non-face-to-face time: obtaining and  reviewing outside history, ordering and reviewing medications, tests or procedures, care coordination (communications with other health care professionals or caregivers) and documentation in the medical record.

## 2024-07-30 ENCOUNTER — Other Ambulatory Visit: Payer: Self-pay | Admitting: Family Medicine

## 2024-07-30 DIAGNOSIS — Z1231 Encounter for screening mammogram for malignant neoplasm of breast: Secondary | ICD-10-CM

## 2024-08-12 ENCOUNTER — Ambulatory Visit
Admission: RE | Admit: 2024-08-12 | Discharge: 2024-08-12 | Disposition: A | Discharge status: Home / Self Care | Source: Ambulatory Visit | Attending: Family Medicine | Admitting: Family Medicine

## 2024-08-12 DIAGNOSIS — Z1231 Encounter for screening mammogram for malignant neoplasm of breast: Secondary | ICD-10-CM

## 2024-09-25 ENCOUNTER — Encounter: Payer: Medicare HMO | Admitting: Adult Health

## 2024-10-07 ENCOUNTER — Encounter: Payer: Self-pay | Admitting: Adult Health

## 2024-10-07 ENCOUNTER — Inpatient Hospital Stay: Attending: Adult Health | Admitting: Adult Health

## 2024-10-07 VITALS — BP 132/48 | HR 66 | Temp 98.5°F | Resp 18 | Ht 61.5 in | Wt 202.2 lb

## 2024-10-07 DIAGNOSIS — M8580 Other specified disorders of bone density and structure, unspecified site: Secondary | ICD-10-CM | POA: Insufficient documentation

## 2024-10-07 DIAGNOSIS — Z8052 Family history of malignant neoplasm of bladder: Secondary | ICD-10-CM | POA: Diagnosis not present

## 2024-10-07 DIAGNOSIS — Z923 Personal history of irradiation: Secondary | ICD-10-CM | POA: Insufficient documentation

## 2024-10-07 DIAGNOSIS — Z803 Family history of malignant neoplasm of breast: Secondary | ICD-10-CM | POA: Diagnosis not present

## 2024-10-07 DIAGNOSIS — Z08 Encounter for follow-up examination after completed treatment for malignant neoplasm: Secondary | ICD-10-CM | POA: Insufficient documentation

## 2024-10-07 DIAGNOSIS — C50412 Malignant neoplasm of upper-outer quadrant of left female breast: Secondary | ICD-10-CM

## 2024-10-07 DIAGNOSIS — Z87891 Personal history of nicotine dependence: Secondary | ICD-10-CM | POA: Insufficient documentation

## 2024-10-07 DIAGNOSIS — Z853 Personal history of malignant neoplasm of breast: Secondary | ICD-10-CM | POA: Diagnosis present

## 2024-10-07 DIAGNOSIS — Z8051 Family history of malignant neoplasm of kidney: Secondary | ICD-10-CM | POA: Diagnosis not present

## 2024-10-07 DIAGNOSIS — Z170 Estrogen receptor positive status [ER+]: Secondary | ICD-10-CM | POA: Diagnosis not present

## 2024-10-07 NOTE — Progress Notes (Unsigned)
 [facility] Cancer Follow up:    Sally Aland, MD [address] [place] [address]   DIAGNOSIS: Cancer Staging  Malignant neoplasm of upper-outer quadrant of left breast in female, estrogen receptor positive (HCC) Staging form: Breast, AJCC 7th Edition - Clinical: Stage IA (T1c, N0, M0) - Unsigned    SUMMARY OF ONCOLOGIC HISTORY: (1) status post left upper outer quadrant lumpectomy and sentinel lymph node biopsy 05/20/2012 for a pT1c pN0, stage IA invasive ductal carcinoma, grade 1, estrogen receptor 100% positive, progesterone receptor 52% positive, with no HER-2 amplification and an MIB-1 of 8%   (2) Oncotype DX score of 0 predicted a distant disease risk of recurrence over 10 years of approximately 3% if the patient's only systemic treatment was tamoxifen for 5 years. It also predicted no benefit from chemotherapy.   (3) the patient completed adjuvant radiation 09/17/2012, receiving 36 treatments to the left breast using medial and lateral tangential fields to a dose of 5040 cGy, followed by a boost to the postoperative bed of an additional 1440 cGy (given dose 6648).   (4) anastrozole  started December 2013, completing 5 years December 2018.   (5) osteopenia -- t-score -2.4 on 06/10/15.              (a) patient opted against Prolia  CURRENT THERAPY: anastrozole   INTERVAL HISTORY:  Discussed the use of AI scribe software for clinical note transcription with the patient, who gave verbal consent to proceed.  History of Present Illness Sally Morrison is a 73 year old female with left breast invasive ductal carcinoma (ER/PR positive, HER2 negative) who presents for routine oncology follow-up.  She was diagnosed in July 2013 and treated with lumpectomy, adjuvant radiation, and five years of anastrozole  completed in December 2018. Her most recent mammogram in October 2025 was normal. She denies cough, dyspnea, bowel or bladder changes, and reports no other health  changes since her last visit.  She has been exercising less over the past few weeks and reports reduced overall food intake with fewer fruits and vegetables, though she is trying to improve her diet.  She received her last COVID vaccine in June 2025 and has received pneumococcal and influenza vaccines after age 55. She continues routine cancer screening and preventive care with her primary care provider.     Patient Active Problem List   Diagnosis Date Noted   Morbid (severe) obesity due to excess calories (HCC) 09/21/2022   Essential hypertension 09/21/2022   Genetic testing 09/13/2020   Family history of breast cancer    Family history of bladder cancer    Family history of kidney cancer    Osteopenia 06/21/2015   Malignant neoplasm of upper-outer quadrant of left breast in female, estrogen receptor positive (HCC) 07/31/2013    is allergic to penicillins and aleve [naproxen sodium].  MEDICAL HISTORY: Past Medical History:  Diagnosis Date   Allergy    Anemia    Breast cancer (HCC)    Cancer (HCC)    Family history of bladder cancer    Family history of breast cancer    Family history of kidney cancer    Hypertension    Personal history of radiation therapy 2013   Sickle cell trait     SURGICAL HISTORY: Past Surgical History:  Procedure Laterality Date   ABDOMINAL HYSTERECTOMY     BREAST BIOPSY Left 04/19/2012   malignant   BREAST LUMPECTOMY Left 05/20/2012   BREAST SURGERY     left breast lumpectomy  CHOLECYSTECTOMY     COLONOSCOPY WITH PROPOFOL  N/A 05/21/2014   Procedure: COLONOSCOPY WITH PROPOFOL ;  Surgeon: Renaye Sous, MD;  Location: [facility];  Service: Endoscopy;  Laterality: N/A;    SOCIAL HISTORY: Social History   Socioeconomic History   Marital status: Widowed    Spouse name: Not on file   Number of children: Not on file   Years of education: Not on file   Highest education level: Not on file  Occupational History   Not on file  Tobacco Use    Smoking status: Former    Current packs/day: 0.25    Average packs/day: 0.3 packs/day for 4.0 years (1.0 ttl pk-yrs)    Types: Cigarettes   Smokeless tobacco: Never   Tobacco comments:    smoked in College for 2-3 years  Substance and Sexual Activity   Alcohol use: No   Drug use: No   Sexual activity: Yes    Birth control/protection: Post-menopausal  Other Topics Concern   Not on file  Social History Narrative   Not on file   Social Drivers of Health   Tobacco Use: Medium Risk (10/07/2024)   Patient History    Smoking Tobacco Use: Former    Smokeless Tobacco Use: Never    Passive Exposure: Not on Actuary Strain: Low Risk (10/07/2024)   Overall Financial Resource Strain (CARDIA)    Difficulty of Paying Living Expenses: Not hard at all  Food Insecurity: No Food Insecurity (10/07/2024)   Epic    Worried About Radiation Protection Practitioner of Food in the Last Year: Never true    Ran Out of Food in the Last Year: Never true  Transportation Needs: No Transportation Needs (10/07/2024)   Epic    Lack of Transportation (Medical): No    Lack of Transportation (Non-Medical): No  Physical Activity: Insufficiently Active (10/07/2024)   Exercise Vital Sign    Days of Exercise per Week: 2 days    Minutes of Exercise per Session: 60 min  Stress: No Stress Concern Present (10/07/2024)   [employer] of Occupational Health - Occupational Stress Questionnaire    Feeling of Stress: Not at all  Social Connections: Moderately Integrated (10/07/2024)   Social Connection and Isolation Panel    Frequency of Communication with Friends and Family: More than three times a week    Frequency of Social Gatherings with Friends and Family: Three times a week    Attends Religious Services: More than 4 times per year    Active Member of Clubs or Organizations: Yes    Attends Banker Meetings: 1 to 4 times per year    Marital Status: Widowed  Intimate Partner Violence: Not At Risk  (10/07/2024)   Epic    Fear of Current or Ex-Partner: No    Emotionally Abused: No    Physically Abused: No    Sexually Abused: No  Depression (PHQ2-9): Low Risk (10/07/2024)   Depression (PHQ2-9)    PHQ-2 Score: 0  Alcohol Screen: Low Risk (10/07/2024)   Alcohol Screen    Last Alcohol Screening Score (AUDIT): 0  Housing: Unknown (10/07/2024)   Epic    Unable to Pay for Housing in the Last Year: No    Number of Times Moved in the Last Year: Not on file    Homeless in the Last Year: No  Utilities: Not At Risk (10/07/2024)   Epic    Threatened with loss of utilities: No  Health Literacy: Adequate Health Literacy (10/07/2024)   B1300  Health Literacy    Frequency of need for help with medical instructions: Never    FAMILY HISTORY: Family History  Problem Relation Age of Onset   Bladder Cancer Sister 51   Breast cancer Maternal Aunt        age dx unk   Breast cancer Cousin    Kidney cancer Cousin        dx 50's   Breast cancer Other    Breast cancer Cousin        dx 20's/30's    Review of Systems  Constitutional:  Negative for appetite change, chills, fatigue, fever and unexpected weight change.  HENT:   Negative for hearing loss, lump/mass and trouble swallowing.   Eyes:  Negative for eye problems and icterus.  Respiratory:  Negative for chest tightness, cough and shortness of breath.   Cardiovascular:  Negative for chest pain, leg swelling and palpitations.  Gastrointestinal:  Negative for abdominal distention, abdominal pain, constipation, diarrhea, nausea and vomiting.  Endocrine: Negative for hot flashes.  Genitourinary:  Negative for difficulty urinating.   Musculoskeletal:  Negative for arthralgias.  Skin:  Negative for itching and rash.  Neurological:  Negative for dizziness, extremity weakness, headaches and numbness.  Hematological:  Negative for adenopathy. Does not bruise/bleed easily.  Psychiatric/Behavioral:  Negative for depression. The patient is not  nervous/anxious.       PHYSICAL EXAMINATION   Onc Performance Status - 10/07/24 0936       ECOG Perf Status   ECOG Perf Status Restricted in physically strenuous activity but ambulatory and able to carry out work of a light or sedentary nature, e.g., light house work, office work      KPS SCALE   KPS % SCORE Able to carry on normal activity, minor s/s of disease          Vitals:   10/07/24 0934  BP: (!) 132/48  Pulse: 66  Resp: 18  Temp: 98.5 F (36.9 C)  SpO2: 100%    Physical Exam Constitutional:      General: She is not in acute distress.    Appearance: Normal appearance. She is not toxic-appearing.  HENT:     Head: Normocephalic and atraumatic.     Mouth/Throat:     Mouth: Mucous membranes are moist.     Pharynx: Oropharynx is clear. No oropharyngeal exudate or posterior oropharyngeal erythema.  Eyes:     General: No scleral icterus. Cardiovascular:     Rate and Rhythm: Normal rate and regular rhythm.     Pulses: Normal pulses.     Heart sounds: Normal heart sounds.  Pulmonary:     Effort: Pulmonary effort is normal.     Breath sounds: Normal breath sounds.  Chest:     Comments: Left breast s/p lumpectomy and radiation, no sign of local recurrence; right breast benign Abdominal:     General: Abdomen is flat. Bowel sounds are normal. There is no distension.     Palpations: Abdomen is soft.     Tenderness: There is no abdominal tenderness.  Musculoskeletal:        General: No swelling.     Cervical back: Neck supple.  Lymphadenopathy:     Cervical: No cervical adenopathy.     Upper Body:     Right upper body: No supraclavicular or axillary adenopathy.     Left upper body: No supraclavicular or axillary adenopathy.  Skin:    General: Skin is warm and dry.     Findings: No rash.  Neurological:     General: No focal deficit present.     Mental Status: She is alert.  Psychiatric:        Mood and Affect: Mood normal.        Behavior: Behavior normal.       ASSESSMENT and THERAPY PLAN:    Assessment and Plan Assessment & Plan Left breast invasive ductal carcinoma, ERPR positive, HER2 negative, status post lumpectomy and adjuvant therapy No breast symptoms or recurrence evidence. - Recommended annual mammography, next due October 2026. - Advised diet rich in fruits and vegetables and regular exercise. - Encouraged continued follow-up with primary care for other cancer screenings and immunizations.      All questions were answered. The patient knows to call the clinic with any problems, questions or concerns. We can certainly see the patient much sooner if necessary.  Total encounter time:30 minutes*in face-to-face visit time, chart review, lab review, care coordination, order entry, and documentation of the encounter time.    Morna Kendall, NP 10/07/2024 9:49 AM Medical Oncology and Hematology [facility] [address] [place], [place] [address] Tel. [phone]    Fax. [phone]  *Total Encounter Time as defined by the Centers for Medicare and Medicaid Services includes, in addition to the face-to-face time of a patient visit (documented in the note above) non-face-to-face time: obtaining and reviewing outside history, ordering and reviewing medications, tests or procedures, care coordination (communications with other health care professionals or caregivers) and documentation in the medical record.

## 2025-10-08 ENCOUNTER — Inpatient Hospital Stay: Admitting: Adult Health
